# Patient Record
Sex: Female | Born: 1965 | Race: White | Hispanic: No | Marital: Married | Smoking: Never smoker
Health system: Southern US, Community
[De-identification: ages and names within clinical notes are randomized; demographics above are authoritative.]

## PROBLEM LIST (undated history)

## (undated) DIAGNOSIS — F32A Depression, unspecified: Secondary | ICD-10-CM

## (undated) DIAGNOSIS — F909 Attention-deficit hyperactivity disorder, unspecified type: Secondary | ICD-10-CM

## (undated) DIAGNOSIS — E079 Disorder of thyroid, unspecified: Secondary | ICD-10-CM

## (undated) DIAGNOSIS — E039 Hypothyroidism, unspecified: Secondary | ICD-10-CM

## (undated) DIAGNOSIS — F329 Major depressive disorder, single episode, unspecified: Secondary | ICD-10-CM

## (undated) DIAGNOSIS — F419 Anxiety disorder, unspecified: Secondary | ICD-10-CM

## (undated) DIAGNOSIS — Z87442 Personal history of urinary calculi: Secondary | ICD-10-CM

## (undated) DIAGNOSIS — R7303 Prediabetes: Secondary | ICD-10-CM

## (undated) HISTORY — DX: Disorder of thyroid, unspecified: E07.9

## (undated) HISTORY — DX: Depression, unspecified: F32.A

## (undated) HISTORY — PX: COLONOSCOPY W/ POLYPECTOMY: SHX1380

## (undated) HISTORY — DX: Anxiety disorder, unspecified: F41.9

## (undated) HISTORY — PX: ABDOMINAL HYSTERECTOMY: SHX81

---
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
## 2005-03-03 ENCOUNTER — Ambulatory Visit: Payer: Self-pay | Admitting: Internal Medicine

## 2006-06-18 ENCOUNTER — Ambulatory Visit: Payer: Self-pay | Admitting: Unknown Physician Specialty

## 2006-12-04 ENCOUNTER — Ambulatory Visit: Payer: Self-pay

## 2006-12-07 ENCOUNTER — Ambulatory Visit: Payer: Self-pay

## 2006-12-08 ENCOUNTER — Ambulatory Visit: Payer: Self-pay | Admitting: Internal Medicine

## 2006-12-09 ENCOUNTER — Ambulatory Visit: Payer: Self-pay | Admitting: Family Medicine

## 2006-12-21 ENCOUNTER — Ambulatory Visit: Payer: Self-pay | Admitting: Gastroenterology

## 2008-02-08 ENCOUNTER — Ambulatory Visit: Payer: Self-pay | Admitting: Unknown Physician Specialty

## 2009-07-17 ENCOUNTER — Observation Stay: Payer: Self-pay | Admitting: Internal Medicine

## 2010-02-18 ENCOUNTER — Ambulatory Visit: Payer: Self-pay | Admitting: Family Medicine

## 2011-05-06 ENCOUNTER — Ambulatory Visit: Payer: Self-pay | Admitting: Family Medicine

## 2014-08-17 ENCOUNTER — Ambulatory Visit: Admission: RE | Admit: 2014-08-17 | Payer: BC Managed Care – PPO | Source: Ambulatory Visit | Admitting: *Deleted

## 2014-08-22 ENCOUNTER — Ambulatory Visit
Admission: RE | Admit: 2014-08-22 | Discharge: 2014-08-22 | Disposition: A | Discharge status: Home / Self Care | Payer: BC Managed Care – PPO | Source: Ambulatory Visit | Attending: Family Medicine | Admitting: Family Medicine

## 2014-08-22 ENCOUNTER — Other Ambulatory Visit: Payer: Self-pay | Admitting: Family Medicine

## 2014-08-22 DIAGNOSIS — M25512 Pain in left shoulder: Secondary | ICD-10-CM | POA: Insufficient documentation

## 2014-08-22 DIAGNOSIS — R1032 Left lower quadrant pain: Secondary | ICD-10-CM

## 2014-08-29 ENCOUNTER — Ambulatory Visit
Admission: RE | Admit: 2014-08-29 | Discharge: 2014-08-29 | Disposition: A | Discharge status: Home / Self Care | Payer: BC Managed Care – PPO | Source: Ambulatory Visit | Attending: Family Medicine | Admitting: Family Medicine

## 2014-08-29 ENCOUNTER — Other Ambulatory Visit: Payer: BC Managed Care – PPO

## 2014-08-29 ENCOUNTER — Ambulatory Visit: Payer: BC Managed Care – PPO

## 2014-08-29 DIAGNOSIS — N8329 Other ovarian cysts: Secondary | ICD-10-CM | POA: Insufficient documentation

## 2014-08-29 DIAGNOSIS — R1032 Left lower quadrant pain: Secondary | ICD-10-CM

## 2015-05-17 ENCOUNTER — Other Ambulatory Visit: Payer: Self-pay | Admitting: Family Medicine

## 2015-05-17 ENCOUNTER — Ambulatory Visit
Admission: RE | Admit: 2015-05-17 | Discharge: 2015-05-17 | Disposition: A | Discharge status: Home / Self Care | Payer: BC Managed Care – PPO | Source: Ambulatory Visit | Attending: Family Medicine | Admitting: Family Medicine

## 2015-05-17 DIAGNOSIS — Z1231 Encounter for screening mammogram for malignant neoplasm of breast: Secondary | ICD-10-CM

## 2015-09-28 ENCOUNTER — Other Ambulatory Visit: Payer: Self-pay | Admitting: Family Medicine

## 2015-09-28 DIAGNOSIS — N83291 Other ovarian cyst, right side: Secondary | ICD-10-CM

## 2015-10-08 ENCOUNTER — Ambulatory Visit
Admission: RE | Admit: 2015-10-08 | Discharge: 2015-10-08 | Disposition: A | Discharge status: Home / Self Care | Payer: BC Managed Care – PPO | Source: Ambulatory Visit | Attending: Family Medicine | Admitting: Family Medicine

## 2015-10-08 DIAGNOSIS — N83291 Other ovarian cyst, right side: Secondary | ICD-10-CM | POA: Diagnosis not present

## 2016-08-25 ENCOUNTER — Other Ambulatory Visit: Payer: Self-pay | Admitting: Family Medicine

## 2016-08-25 DIAGNOSIS — Z1231 Encounter for screening mammogram for malignant neoplasm of breast: Secondary | ICD-10-CM

## 2016-09-01 ENCOUNTER — Ambulatory Visit
Admission: RE | Admit: 2016-09-01 | Discharge: 2016-09-01 | Disposition: A | Discharge status: Home / Self Care | Payer: BC Managed Care – PPO | Source: Ambulatory Visit | Attending: Family Medicine | Admitting: Family Medicine

## 2016-09-01 ENCOUNTER — Other Ambulatory Visit: Payer: Self-pay | Admitting: Family Medicine

## 2016-09-01 DIAGNOSIS — Z1231 Encounter for screening mammogram for malignant neoplasm of breast: Secondary | ICD-10-CM | POA: Insufficient documentation

## 2018-12-17 ENCOUNTER — Other Ambulatory Visit: Payer: Self-pay | Admitting: *Deleted

## 2018-12-17 ENCOUNTER — Ambulatory Visit: Payer: Self-pay | Admitting: Urology

## 2018-12-17 ENCOUNTER — Other Ambulatory Visit: Payer: Self-pay

## 2018-12-17 ENCOUNTER — Ambulatory Visit (INDEPENDENT_AMBULATORY_CARE_PROVIDER_SITE_OTHER): Payer: BC Managed Care – PPO | Admitting: Urology

## 2018-12-17 ENCOUNTER — Other Ambulatory Visit
Admission: RE | Admit: 2018-12-17 | Discharge: 2018-12-17 | Disposition: A | Discharge status: Home / Self Care | Payer: BC Managed Care – PPO | Attending: Urology | Admitting: Urology

## 2018-12-17 ENCOUNTER — Encounter: Payer: Self-pay | Admitting: Urology

## 2018-12-17 VITALS — BP 118/87 | HR 79 | Ht 65.0 in | Wt 180.0 lb

## 2018-12-17 DIAGNOSIS — R3129 Other microscopic hematuria: Secondary | ICD-10-CM | POA: Diagnosis not present

## 2018-12-17 DIAGNOSIS — N941 Unspecified dyspareunia: Secondary | ICD-10-CM | POA: Diagnosis not present

## 2018-12-17 LAB — URINALYSIS, COMPLETE (UACMP) WITH MICROSCOPIC
Bacteria, UA: NONE SEEN
Bilirubin Urine: NEGATIVE
Glucose, UA: NEGATIVE mg/dL
Ketones, ur: NEGATIVE mg/dL
Leukocytes,Ua: NEGATIVE
Nitrite: NEGATIVE
Protein, ur: NEGATIVE mg/dL
Specific Gravity, Urine: 1.02 (ref 1.005–1.030)
pH: 6 (ref 5.0–8.0)

## 2018-12-17 NOTE — Progress Notes (Signed)
12/17/2018 4:27 PM   Job Foundshonda D Feister 12/11/1965 [id number]  Referring provider: Dortha KernBliss, Laura K, MD [address] [place],  [place] [id number]  Chief Complaint  Patient presents with  . Hematuria    HPI: 53 year old female referred for further evaluation of possible microscopic hematuria.  She was seen and evaluated by her new primary care physician, Dr. Park BreedKahn at which time she was noted to have moderate blood on dip.  No microscopic exam was performed.  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  She does have a remote history of kidney stones.  She believes she passed a kidney stone about 10 years ago.  She reports that [facility] CT scan, noncontrast which showed no stones.  We do not have these records.  This was just recently.  Her mother had bladder cancer diagnosed in her 6980s.  She was a heavy smoker.  Her mother's twin brother also had bladder cancer.  Ms. Katrinka BlazingSmith herself has a remote history of social smoking in her late teens and early 4920s.  She is never smoker smoked regularly.  She does have a significant secondhand smoking exposure.  No industrial chemical exposure.  No flank pain.  She does have some chronic low back pain.  She also reports that she fell on a cast iron tub 3 weeks ago.  She is worried this may have caused an injury to her kidneys.  She had blood in her urine noted prior to this.  She also mentions today that she has developed dyspareunia over the past year.  She has dryness and also discomfort with intercourse.  Lubricants do not help much.  Prior to this year, she had no issues with painful intercourse.  This is the same partner.  She is postmenopausal.  She had abdominal hysterectomy about 25 years ago, laparoscopic.  Her cervix was left in place as well as 1 of her ovaries.  She reports that she has an irregular Pap smear and will have it repeated in 6 months.  She has a remote history of recurrent  urinary tract infections.  She is not really had any issues with this over the past year but was having them frequently about 2 years ago.   PMH: Past Medical History:  Diagnosis Date  . Anxiety   . Depression   . Thyroid disease     Surgical History: Past Surgical History:  Procedure Laterality Date  . ABDOMINAL HYSTERECTOMY    . CESAREAN SECTION      Home Medications:  Allergies as of 12/17/2018   No Known Allergies     Medication List       Accurate as of December 17, 2018  4:27 PM. If you have any questions, ask your nurse or doctor.        levothyroxine 75 MCG tablet Commonly known as: SYNTHROID Take 75 mcg by mouth daily before breakfast.   PARoxetine 20 MG tablet Commonly known as: PAXIL Take 20 mg by mouth daily.       Allergies: No Known Allergies  Family History: Family History  Problem Relation Age of Onset  . Breast cancer Cousin        mat cousin  . Bladder Cancer Mother   . Bladder Cancer Maternal Uncle     Social History:  reports that she has never smoked. She has never used smokeless tobacco. She reports current alcohol use. No history on file for drug.  ROS: UROLOGY Frequent Urination?: No Hard to postpone urination?: Yes Burning/pain with urination?: No Get up at night to urinate?: No Leakage of urine?: No Urine stream starts and stops?: No Trouble starting stream?: No Do you have to strain to urinate?: No Blood in urine?: Yes Urinary tract infection?: No Sexually transmitted disease?: No Injury to kidneys or bladder?: Yes Painful intercourse?: No Weak stream?: No Currently pregnant?: No Vaginal bleeding?: No Last menstrual period?: n  Gastrointestinal Nausea?: No Vomiting?: No Indigestion/heartburn?: No Diarrhea?: No Constipation?: No  Constitutional Fever: No Night sweats?: No Weight loss?: No Fatigue?: No  Skin Skin rash/lesions?: No Itching?: No  Eyes Blurred vision?: No Double vision?: No   Ears/Nose/Throat Sore throat?: No Sinus problems?: No  Hematologic/Lymphatic Swollen glands?: No Easy bruising?: No  Cardiovascular Leg swelling?: No Chest pain?: No  Respiratory Cough?: No Shortness of breath?: No  Endocrine Excessive thirst?: No  Musculoskeletal Back pain?: No Joint pain?: No  Neurological Headaches?: No Dizziness?: No  Psychologic Depression?: No Anxiety?: No  Physical Exam: BP 118/87   Pulse 79   Ht 5\' 5"  (1.651 m)   Wt 180 lb (81.6 kg)   BMI 29.95 kg/m   Constitutional:  Alert and oriented, No acute distress. HEENT: [place] AT, moist mucus membranes.  Trachea midline, no masses. Cardiovascular: No clubbing, cyanosis, or edema. Respiratory: Normal respiratory effort, no increased work of breathing. GI: Abdomen is soft, nontender, nondistended, no abdominal masses Skin: No rashes, bruises or suspicious lesions. Neurologic: Grossly intact, no focal deficits, moving all 4 extremities. Psychiatric: Normal mood and affect.  Laboratory Data:  Urinalysis    Component Value Date/Time   COLORURINE YELLOW 12/17/2018 1543   APPEARANCEUR CLEAR 12/17/2018 1543   LABSPEC 1.020 12/17/2018 1543   PHURINE 6.0 12/17/2018 1543   GLUCOSEU NEGATIVE 12/17/2018 1543   HGBUR MODERATE (A) 12/17/2018 1543   BILIRUBINUR NEGATIVE 12/17/2018 1543   KETONESUR NEGATIVE 12/17/2018 1543   PROTEINUR NEGATIVE 12/17/2018 1543   NITRITE NEGATIVE 12/17/2018 1543   LEUKOCYTESUR NEGATIVE 12/17/2018 1543    Lab Results  Component Value Date   BACTERIA NONE SEEN 12/17/2018   11-20 red blood cells per high-powered field.  Pertinent Imaging: N/A  Assessment & Plan:    1. Microscopic hematuria We discussed the differential diagnosis for microscopic hematuria including nephrolithiasis, renal or upper tract tumors, bladder stones, UTIs, or bladder tumors as well as undetermined etiologies. Per AUA guidelines, I did recommend complete microscopic hematuria evaluation  including CTU, possible urine cytology, and office cystoscopy.  2. Dyspareunia, female May be related to vaginal dryness Pelvic exam at the same time of cystoscopy May benefit from topical estrogen cream both for history of recurrent UTIs as well as dyspareunia and vaginal dryness-would like to see results of Pap given that she was told was abnormal with "inflammation" but not cancer.   Return in about 4 weeks (around 01/14/2019) for CT scan, cysto, pelvic.  Hollice Espy, MD  [facility] [address] [place], [place] [address] [phone]  Patient signed a release for additional records for CT scan results as well as Pap results

## 2018-12-22 ENCOUNTER — Other Ambulatory Visit: Payer: Self-pay

## 2018-12-28 ENCOUNTER — Telehealth: Payer: Self-pay | Admitting: Urology

## 2018-12-28 DIAGNOSIS — R3129 Other microscopic hematuria: Secondary | ICD-10-CM

## 2018-12-28 NOTE — Telephone Encounter (Signed)
I spoke with the patient and she is ok with waiting until her app  However I do not see an order for a CT scan, I did see in the notes that we needed her last CT scan from Dr. Humphrey Rolls which is scanned in her chart under media. If she does need another on can I please get an order so that I can get this approved and scheduled please and thank you?    Sharyn Lull

## 2018-12-28 NOTE — Telephone Encounter (Signed)
Pt called and wants to be seen sooner than her appt on  01/14/2019, and have her CT done sooner. She states that it is ok to be seen at [facility] or [place]. Please advise.

## 2018-12-28 NOTE — Telephone Encounter (Signed)
As long as there is something available schedule wise that's fine, otherwise unfortunately she will have to wait. Of course radiology handles CT so we don't have any control over how soon they schedule her

## 2018-12-29 NOTE — Telephone Encounter (Signed)
Orders placed.

## 2018-12-31 ENCOUNTER — Ambulatory Visit: Payer: Self-pay | Admitting: Urology

## 2019-01-10 ENCOUNTER — Other Ambulatory Visit: Payer: Self-pay

## 2019-01-10 ENCOUNTER — Ambulatory Visit
Admission: RE | Admit: 2019-01-10 | Discharge: 2019-01-10 | Disposition: A | Discharge status: Home / Self Care | Payer: BC Managed Care – PPO | Source: Ambulatory Visit | Attending: Urology | Admitting: Urology

## 2019-01-10 DIAGNOSIS — R3129 Other microscopic hematuria: Secondary | ICD-10-CM | POA: Diagnosis present

## 2019-01-10 MED ORDER — IOHEXOL 300 MG/ML  SOLN
125.0000 mL | Freq: Once | INTRAMUSCULAR | Status: AC | PRN
  Administered 2019-01-10: 08:00:00 125 mL via INTRAVENOUS

## 2019-01-11 ENCOUNTER — Other Ambulatory Visit: Payer: Self-pay

## 2019-01-11 DIAGNOSIS — R3129 Other microscopic hematuria: Secondary | ICD-10-CM

## 2019-01-14 ENCOUNTER — Other Ambulatory Visit
Admission: RE | Admit: 2019-01-14 | Discharge: 2019-01-14 | Disposition: A | Discharge status: Home / Self Care | Payer: BC Managed Care – PPO | Attending: Urology | Admitting: Urology

## 2019-01-14 ENCOUNTER — Ambulatory Visit: Payer: BC Managed Care – PPO | Admitting: Urology

## 2019-01-14 ENCOUNTER — Other Ambulatory Visit: Payer: Self-pay

## 2019-01-14 ENCOUNTER — Encounter: Payer: Self-pay | Admitting: Urology

## 2019-01-14 VITALS — BP 123/96 | HR 87 | Ht 65.0 in | Wt 180.0 lb

## 2019-01-14 DIAGNOSIS — R3129 Other microscopic hematuria: Secondary | ICD-10-CM | POA: Diagnosis present

## 2019-01-14 DIAGNOSIS — N941 Unspecified dyspareunia: Secondary | ICD-10-CM

## 2019-01-14 LAB — URINALYSIS, COMPLETE (UACMP) WITH MICROSCOPIC
Bacteria, UA: NONE SEEN
Bilirubin Urine: NEGATIVE
Glucose, UA: NEGATIVE mg/dL
Ketones, ur: NEGATIVE mg/dL
Leukocytes,Ua: NEGATIVE
Nitrite: NEGATIVE
Protein, ur: NEGATIVE mg/dL
Specific Gravity, Urine: 1.02 (ref 1.005–1.030)
pH: 5 (ref 5.0–8.0)

## 2019-01-14 NOTE — Progress Notes (Signed)
   01/20/19  CC:  Chief Complaint  Patient presents with  . Cysto    HPI: 53 year old female with back scopic hematuria who presents today for cystoscopic evaluation.  In the interim, she is undergone CT urogram on 01/10/2019 which is completely unremarkable, without any pathology.  This was personally reviewed today and agree with radiologic interpretation.  Blood pressure (!) 123/96, pulse 87, height 5\' 5"  (1.651 m), weight 180 lb (81.6 kg). NED. A&Ox3.   No respiratory distress   Abd soft, NT, ND Normal external genitalia with patent urethral meatus Notably, there is some atrophic vaginal mucosa and periurethral mucosa with fairly good pelvic floor support without obvious prolapse.  Cystoscopy Procedure Note  Patient identification was confirmed, informed consent was obtained, and patient was prepped using Betadine solution.  Lidocaine jelly was administered per urethral meatus.    Procedure: - Flexible cystoscope introduced, without any difficulty.   - Thorough search of the bladder revealed:    normal urethral meatus    normal urothelium    no stones    no ulcers     no tumors    no urethral polyps    no trabeculation  - Ureteral orifices were normal in position and appearance.  Post-Procedure: - Patient tolerated the procedure well  Assessment/ Plan:  1. Microscopic hematuria s/p negative evaluation clotting cystoscopy and negative CT urogram This is reassuring Given her smoking history and degree of hematuria, they go to follow-up with [place] in a year with a UA possible urine cytology.  She would like any a cystoscopy every 2 to 3 years of her microscopic hematuria persist.  She is agreeable with this plan.  2. Dyspareunia, female Yet to receive results of abnormal Pap, will hold off on treating with topical estrogen cream until we receive this or she can follow-up with her primary care    Return in about 1 year (around 01/14/2020) for [facility] for UA.  Hollice Espy, MD

## 2020-01-16 ENCOUNTER — Ambulatory Visit: Payer: BC Managed Care – PPO | Admitting: Urology

## 2021-06-20 ENCOUNTER — Other Ambulatory Visit: Payer: Self-pay | Admitting: Orthopedic Surgery

## 2021-06-20 ENCOUNTER — Other Ambulatory Visit (HOSPITAL_COMMUNITY): Payer: Self-pay | Admitting: Orthopedic Surgery

## 2021-06-20 DIAGNOSIS — M25561 Pain in right knee: Secondary | ICD-10-CM

## 2021-06-26 ENCOUNTER — Other Ambulatory Visit: Payer: Self-pay

## 2021-06-26 ENCOUNTER — Ambulatory Visit
Admission: RE | Admit: 2021-06-26 | Discharge: 2021-06-26 | Disposition: A | Discharge status: Home / Self Care | Payer: BC Managed Care – PPO | Source: Ambulatory Visit | Attending: Orthopedic Surgery | Admitting: Orthopedic Surgery

## 2021-06-26 DIAGNOSIS — M25561 Pain in right knee: Secondary | ICD-10-CM | POA: Insufficient documentation

## 2021-08-11 NOTE — Discharge Instructions (Addendum)
Instructions after Knee Arthroscopy    James P. Hooten, Jr., M.D.     Dept. of Orthopaedics & Sports Medicine  [facility]  [address]  [place], [place]  [address]   Phone: [phone]   Fax: [phone]   DIET: Drink plenty of non-alcoholic fluids & begin a light diet. Resume your normal diet the day after surgery.  ACTIVITY:  You may use crutches or a walker with weight-bearing as tolerated, unless instructed otherwise. You may wean yourself off of the walker or crutches as tolerated.  Begin doing gentle exercises. Exercising will reduce the pain and swelling, increase motion, and prevent muscle weakness.   Avoid strenuous activities or athletics for a minimum of 4-6 weeks after arthroscopic surgery. Do not drive or operate any equipment until instructed.  WOUND CARE:  Place one to two pillows under the knee the first day or two when sitting or lying.  Continue to use the ice packs periodically to reduce pain and swelling. The small incisions in your knee are closed with nylon stitches. The stitches will be removed in the office. The bulky dressing may be removed on the second day after surgery. DO NOT TOUCH THE STITCHES. Put a Band-Aid over each stitch. Do NOT use any ointments or creams on the incisions.  You may bathe or shower after the stitches are removed at the first office visit following surgery.  MEDICATIONS: You may resume your regular medications. Please take the pain medication as prescribed. Do not take pain medication on an empty stomach. Do not drive or drink alcoholic beverages when taking pain medications.  CALL THE OFFICE FOR: Temperature above 101 degrees Excessive bleeding or drainage on the dressing. Excessive swelling, coldness, or paleness of the toes. Persistent nausea and vomiting.  FOLLOW-UP:  You should have an appointment to return to the office in 7-10 days after surgery.      [facility] Department Directory          [email or web address]       [email or web address]          Cardiology  Appointments: [place] - [phone] [place] - [phone]  Endocrinology  Appointments: [place] - [phone] [place] - [phone]  Gastroenterology  Appointments: [place] - [phone] [place] - [phone]        General Surgery   Appointments: [place] - [phone]  Internal Medicine/Family Medicine  Appointments: [place] - [phone] [place] - [phone] [place] - [phone]  Metabolic and Weigh Loss Surgery  Appointments: [place] - [phone]        Neurology  Appointments: [place] - [phone] [place] - [phone]  Neurosurgery  Appointments: [place] - [phone]  Obstetrics & Gynecology  Appointments: [place] - [phone] [place] - [phone]        Pediatrics  Appointments: [place] - [phone] [place] - [phone]  Physiatry  Appointments: [place] -[phone]  Physical Therapy  Appointments: [place] - [phone] [place] - [phone]        Podiatry  Appointments: [place] - [phone] [place] - [phone]  Pulmonology  Appointments: [place] - [phone]  Rheumatology  Appointments: [place] - [phone]        [place] Location: [facility]  [address] , [place]  [address]  [place] Location: [facility] [address] [place], [place]  [address]  [place] Location: [facility] [address] [place], [place]  [address]      AMBULATORY SURGERY  DISCHARGE INSTRUCTIONS   The drugs that you were given will stay in your system until tomorrow so for the next 24   hours you should not:  Drive an automobile Make any legal decisions Drink any alcoholic beverage   You may resume regular meals tomorrow.  Today it is better to start with liquids and gradually work up to solid foods.  You may eat anything you prefer, but it is better to  start with liquids, then soup and crackers, and gradually work up to solid foods.   Please notify your doctor immediately if you have any unusual bleeding, trouble breathing, redness and pain at the surgery site, drainage, fever, or pain not relieved by medication.    Additional Instructions:   Please contact your physician with any problems or Same Day Surgery at [phone], Monday through Friday 6 am to 4 pm, or [place] at [place] Main number at [phone]. 

## 2021-08-14 ENCOUNTER — Other Ambulatory Visit
Admission: RE | Admit: 2021-08-14 | Discharge: 2021-08-14 | Disposition: A | Discharge status: Home / Self Care | Payer: BC Managed Care – PPO | Source: Ambulatory Visit | Attending: Orthopedic Surgery | Admitting: Orthopedic Surgery

## 2021-08-14 ENCOUNTER — Other Ambulatory Visit: Payer: Self-pay

## 2021-08-14 HISTORY — DX: Hypothyroidism, unspecified: E03.9

## 2021-08-14 HISTORY — DX: Personal history of urinary calculi: Z87.442

## 2021-08-14 HISTORY — DX: Attention-deficit hyperactivity disorder, unspecified type: F90.9

## 2021-08-14 HISTORY — DX: Prediabetes: R73.03

## 2021-08-14 NOTE — Patient Instructions (Addendum)
Your procedure is scheduled on: 08/21/21 - Wednesday ?Report to the Registration Desk on the 1st floor of the [facility]. ?To find out your arrival time, please call [phone] between 1PM - 3PM on: 08/20/21 - Tuesday ?If your arrival time is 6:00 am, do not arrive prior to that time as the [facility] entrance doors do not open until 6:00 am. ? ?REMEMBER: ?Instructions that are not followed completely may result in serious medical risk, up to and including death; or upon the discretion of your surgeon and anesthesiologist your surgery may need to be rescheduled. ? ?Do not eat food after midnight the night before surgery.  ?No gum chewing, lozengers or hard candies. ? ?You may however, drink CLEAR liquids up to 2 hours before you are scheduled to arrive for your surgery. Do not drink anything within 2 hours of your scheduled arrival time. ? ?Clear liquids include: ?- water  ?- apple juice without pulp ?- gatorade (not RED colors) ?- black coffee or tea (Do NOT add milk or creamers to the coffee or tea) ?Do NOT drink anything that is not on this list. ? ?TAKE THESE MEDICATIONS THE MORNING OF SURGERY WITH A SIP OF WATER: ? ?- levothyroxine (SYNTHROID) 75 MCG tablet ? ?One week prior to surgery: Meloxicam (MOBIC) 7.5 MG tablet ?Stop Anti-inflammatories (NSAIDS) such as Advil, Aleve, Ibuprofen, Motrin, Naproxen, Naprosyn and Aspirin based products such as Excedrin, Goodys Powder, BC Powder. ? ?Stop ANY OVER THE COUNTER supplements until after surgery. ? ?You may take Tylenol if needed for pain up until the day of surgery. ? ?No Alcohol for 24 hours before or after surgery. ? ?No Smoking including e-cigarettes for 24 hours prior to surgery.  ?No chewable tobacco products for at least 6 hours prior to surgery.  ?No nicotine patches on the day of surgery. ? ?Do not use any "recreational" drugs for at least a week prior to your surgery.  ?Please be advised that the combination of cocaine and anesthesia may have  negative outcomes, up to and including death. ?If you test positive for cocaine, your surgery will be cancelled. ? ?On the morning of surgery brush your teeth with toothpaste and water, you may rinse your mouth with mouthwash if you wish. ?Do not swallow any toothpaste or mouthwash. ? ?Use CHG Soap or wipes as directed on instruction sheet. ? ?Do not wear jewelry, make-up, hairpins, clips or nail polish. ? ?Do not wear lotions, powders, or perfumes.  ? ?Do not shave body from the neck down 48 hours prior to surgery just in case you cut yourself which could leave a site for infection.  ?Also, freshly shaved skin may become irritated if using the CHG soap. ? ?Contact lenses, hearing aids and dentures may not be worn into surgery. ? ?Do not bring valuables to the hospital. [facility] is not responsible for any missing/lost belongings or valuables.  ? ?Notify your doctor if there is any change in your medical condition (cold, fever, infection). ? ?Wear comfortable clothing (specific to your surgery type) to the hospital. ? ?After surgery, you can help prevent lung complications by doing breathing exercises.  ?Take deep breaths and cough every 1-2 hours. Your doctor may order a device called an Incentive Spirometer to help you take deep breaths. ?When coughing or sneezing, hold a pillow firmly against your incision with both hands. This is called ?splinting.? Doing this helps protect your incision. It also decreases belly discomfort. ? ?If you are being admitted to the  hospital overnight, leave your suitcase in the car. ?After surgery it may be brought to your room. ? ?If you are being discharged the day of surgery, you will not be allowed to drive home. ?You will need a responsible adult (18 years or older) to drive you home and stay with you that night.  ? ?If you are taking public transportation, you will need to have a responsible adult (18 years or older) with you. ?Please confirm with your physician that it is  acceptable to use public transportation.  ? ?Please call the Pre-admissions Testing Dept. at [phone] if you have any questions about these instructions. ? ?Surgery Visitation Policy: ? ?Patients undergoing a surgery or procedure may have two family members or support persons with them as long as the person is not COVID-19 positive or experiencing its symptoms.  ? ?Inpatient Visitation:   ? ?Visiting hours are 7 a.m. to 8 p.m. ?Up to four visitors are allowed at one time in a patient room, including children. The visitors may rotate out with other people during the day. One designated support person (adult) may remain overnight.  ?

## 2021-08-18 NOTE — H&P (Signed)
ORTHOPAEDIC HISTORY & PHYSICAL ?Eugene Zeiders, Adelina MingsJames Philmon Jr., MD - 07/29/2021 2:30 PM EDT ?Formatting of this note is different from the original. ?Images from the original note were not included. ?Chief Complaint: ?Chief Complaint  ?Patient presents with  ? Right knee meniscu tear MRI 06/27/21  ? ?Reason for Visit: ?The patient is a 56 y.o. female who presents today for reevaluation of her right knee. She reports a 2 year history of right knee pain. She does not recall any specific trauma or aggravating event. She localizes most of the pain along the medial aspect of the knee and describes the pain as "sharp and catching". She reports some swelling, no locking, and no giving way of the knee. The pain is aggravated by lateral movements and pivoting. The patient has not appreciated any significant improvement despite oral and topical NSAIDs, intraarticular corticosteroid injection, and activity modification.  ? ?Medications: ?Current Outpatient Medications  ?Medication Sig Dispense Refill  ? levothyroxine (SYNTHROID) 75 MCG tablet Take 75 mcg by mouth once daily Take on an empty stomach with a glass of water at least 30-60 minutes before breakfast.  ? PARoxetine (PAXIL) 20 MG tablet TAKE 1 TABLET BY MOUTH EVERY DAY - **NOTE DOSE INCREASE**  ? ?No current facility-administered medications for this visit.  ? ?Allergies: ?No Known Allergies ? ?Past Medical History: ?Past Medical History:  ?Diagnosis Date  ? Chicken pox  ? Hyperlipidemia  ? Hypothyroidism  ? ?Past Surgical History: ?Past Surgical History:  ?Procedure Laterality Date  ? COLONOSCOPY 05/16/2020  ?Diverticulosis/Otherwise normal/PHx CP/Repeat 5 to 7893yrs/[place]  ? CESAREAN SECTION  ? WISDOM TEETH  ? ?Social History: ?Social History  ? ?Socioeconomic History  ? Marital status: Divorced  ? Number of children: 2  ? Years of education: GED  ? Highest education level: GED or equivalent  ?Occupational History  ? Occupation: Environmental education officerull-time - Surveyor, miningchool Bus Driver  ?Tobacco Use  ?  Smoking status: Never  ? Smokeless tobacco: Never  ?Substance and Sexual Activity  ? Alcohol use: Never  ? Drug use: Never  ? Sexual activity: Defer  ?Partners: Male  ? ?Family History: ?Family History  ?Problem Relation Age of Onset  ? Bladder Cancer Mother  ? Emphysema Father  ? Thyroid disease Sister  ? COPD Brother  ? ?Review of Systems: ?A comprehensive 14 point ROS was performed, reviewed, and the pertinent orthopaedic findings are documented in the HPI. ? ?Exam ?BP 132/84  Ht 165.1 cm (5\' 5" )  Wt 87 kg (191 lb 12.8 oz)  BMI 31.92 kg/m?  ? ?General:  ?Well-developed, well-nourished female seen in no acute distress.  ?Even heel to toe gait.  ?No varus or valgus thrust to the right knee. ? ?HEENT:  ?Atraumatic, normocephalic. Pupils are equal and reactive to light. Extraocular motion is intact. Sclera are clear. Oropharynx is clear with moist mucosa. ? ?Lungs:  ?Clear to auscultation bilaterally. ? ?Cardiovascular: ?Regular rate and rhythm. Normal S1, S2. No murmur . No appreciable gallops or rubs. Peripheral pulses are palpable. No lower extremity edema. Homan`s test is negative.  ? ?Extremities: ?Good strength, stability, and range of motion of the upper extremities. ?Good range of motion of the hips and ankles. ? ?Right Knee:  ?Soft tissue swelling: minimal ?Effusion: none ?Erythema: none ?Crepitance: none ?Tenderness: medial ?Alignment: normal ?Mediolateral laxity: stable ?Anterior drawer test:negative ?Lachman`s test: negative ?McMurray`s test: positive ?Atrophy: No significant atrophy.  ?Quadriceps tone was fair to good. ?Range of Motion: greater than 120 degrees ? ?Neurologic:  ?Awake, alert, and oriented.  ?  Sensory function is intact to pinprick and light touch.  ?Motor strength is judged to be 5/5.  ?Motor coordination is within normal limits.  ?No apparent clonus. No tremor.  ? ?MRI: ?I reviewed the right knee MRI from [facility] dated 06/27/2021. I concur with the radiologist's  interpretation as below: ? ?MRI OF THE RIGHT KNEE WITHOUT CONTRAST  ? ?TECHNIQUE:  ?Multiplanar, multisequence MR imaging of the knee was performed. No  ?intravenous contrast was administered.  ? ?COMPARISON:  None.  ? ?FINDINGS:  ?MENISCI  ? ?Medial meniscus: Irregular free edge and undersurface tearing of the  ?medial meniscus centered at the posterior horn-body junction (series  ?7, images 21-23).  ? ?Lateral meniscus:  Intact.  ? ?LIGAMENTS  ? ?Cruciates: Intact ACL and PCL.  ? ?Collaterals: Intact MCL with mild periligamentous edema. Lateral  ?collateral ligament complex intact.  ? ?CARTILAGE  ? ?Patellofemoral: High-grade cartilage loss along the inferior margin  ?of the lateral patellar facet.  ? ?Medial: Mild chondral thinning of the weight-bearing medial  ?compartment.  ? ?Lateral:  No chondral defect.  ? ?MISCELLANEOUS  ? ?Joint: Small joint effusion. Mild focal edema within the  ?superolateral aspect of Hoffa's fat.  ? ?Popliteal Fossa:  Small Baker's cyst. Intact popliteus tendon.  ? ?Extensor Mechanism: Mild tendinosis of the distal quadriceps tendon  ?and distal patellar tendons without tear.  ? ?Bones: No acute fracture. No dislocation. Mild reactive subchondral  ?marrow signal changes in the lateral patella. Otherwise, no bone  ?marrow edema. No marrow replacing bone lesion.  ? ?Other: No significant periarticular soft tissue findings.  ? ?IMPRESSION:  ?1. Medial and patellofemoral compartment osteoarthritis with  ?high-grade cartilage loss along the inferior margin of the lateral  ?patellar facet.  ?2. Irregular free edge and undersurface tearing of the medial  ?meniscus centered at the posterior horn-body junction.  ?3. Grade 1 MCL sprain.  ?4. Mild tendinosis of the distal quadriceps tendon and distal  ?patellar tendon without tear.  ?5. Small joint effusion and small Baker's cyst.  ?6. Focal edema within the superolateral aspect of Hoffa's fat pad,  ?which can be seen in the setting of patellar  tendon-lateral femoral  ?condyle friction syndrome.  ? ?Electronically Signed  ?  By: Duanne Guess D.O.  ?  On: 06/27/2021 11:49 ? ?Impression: ?Internal derangement of the right knee ? ?Plan:  ?The findings were discussed in detail with the patient. ?The patient was given informational material on knee arthroscopy. ?Conservative treatment options were reviewed with the patient. We discussed the risks and benefits of surgical intervention. Arthroscopy is an appropriate treatment for the meniscal pathology, but would have limited or no effect on degenerative changes of the articular cartilage. The usual perioperative course was also discussed in detail. The patient expressed understanding of the risks and benefits of surgical intervention and would like to proceed with plans for right knee arthroscopy. ? ?I spent a total of 45 minutes in both face-to-face and non-face-to-face activities, excluding procedures performed, for this visit on the date of this encounter. ? ?MEDICAL CLEARANCE: ?Per anesthesiology. ?ACTIVITIES:  ?Avoid pivoting, squatting, or twisting. ?WORK STATUS: ?Anticipate out of work for 2-3 weeks. ?THERAPY: ?Quadriceps strengthening exercises. ?MEDICATIONS: ?Requested Prescriptions  ? ?No prescriptions requested or ordered in this encounter  ? ?FOLLOW-UP: ?Return for postoperative follow-up.  ? ?Nadalie Laughner P. Angie Fava., M.D. ? ?This note was generated in part with voice recognition software and I apologize for any typographical errors that were not detected and corrected.  ?  Electronically signed by Shari Heritage., MD at 08/04/2021 8:14 AM EDT ? ?

## 2021-08-20 MED ORDER — CHLORHEXIDINE GLUCONATE 0.12 % MT SOLN
15.0000 mL | Freq: Once | OROMUCOSAL | Status: AC
  Administered 2021-08-21: 15 mL via OROMUCOSAL

## 2021-08-20 MED ORDER — LACTATED RINGERS IV SOLN: INTRAVENOUS | Status: DC

## 2021-08-20 MED ORDER — ORAL CARE MOUTH RINSE: 15.0000 mL | Freq: Once | OROMUCOSAL | Status: AC

## 2021-08-20 MED ORDER — CELECOXIB 200 MG PO CAPS
400.0000 mg | ORAL_CAPSULE | Freq: Once | ORAL | Status: AC
Start: 2021-08-20 — End: 2021-08-21
  Administered 2021-08-21: 400 mg via ORAL

## 2021-08-20 MED ORDER — FAMOTIDINE 20 MG PO TABS
20.0000 mg | ORAL_TABLET | Freq: Once | ORAL | Status: AC
Start: 2021-08-20 — End: 2021-08-21
  Administered 2021-08-21: 20 mg via ORAL

## 2021-08-21 ENCOUNTER — Encounter
Admission: RE | Disposition: A | Discharge status: Home / Self Care | Payer: Self-pay | Source: Home / Self Care | Attending: Orthopedic Surgery

## 2021-08-21 ENCOUNTER — Encounter: Payer: Self-pay | Admitting: Orthopedic Surgery

## 2021-08-21 ENCOUNTER — Ambulatory Visit
Admission: RE | Admit: 2021-08-21 | Discharge: 2021-08-21 | Disposition: A | Discharge status: Home / Self Care | Payer: BC Managed Care – PPO | Attending: Orthopedic Surgery | Admitting: Orthopedic Surgery

## 2021-08-21 ENCOUNTER — Ambulatory Visit: Payer: BC Managed Care – PPO | Admitting: Certified Registered Nurse Anesthetist

## 2021-08-21 ENCOUNTER — Other Ambulatory Visit: Payer: Self-pay

## 2021-08-21 DIAGNOSIS — E039 Hypothyroidism, unspecified: Secondary | ICD-10-CM | POA: Insufficient documentation

## 2021-08-21 DIAGNOSIS — Z9889 Other specified postprocedural states: Secondary | ICD-10-CM

## 2021-08-21 DIAGNOSIS — M2391 Unspecified internal derangement of right knee: Secondary | ICD-10-CM | POA: Insufficient documentation

## 2021-08-21 DIAGNOSIS — X58XXXA Exposure to other specified factors, initial encounter: Secondary | ICD-10-CM | POA: Diagnosis not present

## 2021-08-21 DIAGNOSIS — S83241A Other tear of medial meniscus, current injury, right knee, initial encounter: Secondary | ICD-10-CM | POA: Diagnosis present

## 2021-08-21 HISTORY — PX: KNEE ARTHROSCOPY: SHX127

## 2021-08-21 SURGERY — ARTHROSCOPY, KNEE
Anesthesia: General | Site: Knee | Laterality: Right

## 2021-08-21 MED ORDER — PROPOFOL 10 MG/ML IV BOLUS
INTRAVENOUS | Status: DC | PRN
  Administered 2021-08-21: 150 mg via INTRAVENOUS

## 2021-08-21 MED ORDER — PROPOFOL 10 MG/ML IV BOLUS
INTRAVENOUS | Status: AC
  Filled 2021-08-21: qty 20

## 2021-08-21 MED ORDER — MORPHINE SULFATE (PF) 4 MG/ML IV SOLN
INTRAVENOUS | Status: DC | PRN
  Administered 2021-08-21: 31 mL via INTRAMUSCULAR

## 2021-08-21 MED ORDER — MORPHINE SULFATE (PF) 4 MG/ML IV SOLN
INTRAVENOUS | Status: AC
  Filled 2021-08-21: qty 1

## 2021-08-21 MED ORDER — HYDROCODONE-ACETAMINOPHEN 7.5-325 MG PO TABS: 1.0000 | ORAL_TABLET | ORAL | Status: DC | PRN

## 2021-08-21 MED ORDER — DEXAMETHASONE SODIUM PHOSPHATE 10 MG/ML IJ SOLN
INTRAMUSCULAR | Status: DC | PRN
  Administered 2021-08-21: 8 mg via INTRAVENOUS

## 2021-08-21 MED ORDER — FENTANYL CITRATE (PF) 100 MCG/2ML IJ SOLN
INTRAMUSCULAR | Status: DC | PRN
  Administered 2021-08-21 (×2): 50 ug via INTRAVENOUS

## 2021-08-21 MED ORDER — MORPHINE SULFATE (PF) 2 MG/ML IV SOLN: 0.5000 mg | INTRAVENOUS | Status: DC | PRN

## 2021-08-21 MED ORDER — OXYCODONE HCL 5 MG/5ML PO SOLN: 5.0000 mg | Freq: Once | ORAL | Status: AC | PRN

## 2021-08-21 MED ORDER — CHLORHEXIDINE GLUCONATE 0.12 % MT SOLN
OROMUCOSAL | Status: AC
  Filled 2021-08-21: qty 15

## 2021-08-21 MED ORDER — ACETAMINOPHEN 10 MG/ML IV SOLN
INTRAVENOUS | Status: DC | PRN
  Administered 2021-08-21: 1000 mg via INTRAVENOUS

## 2021-08-21 MED ORDER — ONDANSETRON HCL 4 MG/2ML IJ SOLN
INTRAMUSCULAR | Status: DC | PRN
  Administered 2021-08-21: 4 mg via INTRAVENOUS

## 2021-08-21 MED ORDER — BUPIVACAINE-EPINEPHRINE (PF) 0.25% -1:200000 IJ SOLN
INTRAMUSCULAR | Status: AC
  Filled 2021-08-21: qty 30

## 2021-08-21 MED ORDER — HYDROCODONE-ACETAMINOPHEN 5-325 MG PO TABS: 1.0000 | ORAL_TABLET | Freq: Four times a day (QID) | ORAL | 0 refills | Status: DC | PRN

## 2021-08-21 MED ORDER — OXYCODONE HCL 5 MG PO TABS
ORAL_TABLET | ORAL | Status: AC
  Filled 2021-08-21: qty 1

## 2021-08-21 MED ORDER — LIDOCAINE HCL (CARDIAC) PF 100 MG/5ML IV SOSY
PREFILLED_SYRINGE | INTRAVENOUS | Status: DC | PRN
  Administered 2021-08-21: 100 mg via INTRAVENOUS

## 2021-08-21 MED ORDER — LIDOCAINE HCL (PF) 2 % IJ SOLN
INTRAMUSCULAR | Status: AC
  Filled 2021-08-21: qty 5

## 2021-08-21 MED ORDER — GLYCOPYRROLATE 0.2 MG/ML IJ SOLN
INTRAMUSCULAR | Status: DC | PRN
  Administered 2021-08-21: .2 mg via INTRAVENOUS

## 2021-08-21 MED ORDER — ONDANSETRON HCL 4 MG/2ML IJ SOLN: 4.0000 mg | Freq: Four times a day (QID) | INTRAMUSCULAR | Status: DC | PRN

## 2021-08-21 MED ORDER — SODIUM CHLORIDE 0.9 % IV SOLN: INTRAVENOUS | Status: DC

## 2021-08-21 MED ORDER — PHENYLEPHRINE HCL (PRESSORS) 10 MG/ML IV SOLN
INTRAVENOUS | Status: DC | PRN
  Administered 2021-08-21 (×2): 80 ug via INTRAVENOUS
  Administered 2021-08-21 (×2): 40 ug via INTRAVENOUS
  Administered 2021-08-21 (×4): 80 ug via INTRAVENOUS

## 2021-08-21 MED ORDER — ONDANSETRON HCL 4 MG PO TABS: 4.0000 mg | ORAL_TABLET | Freq: Four times a day (QID) | ORAL | Status: DC | PRN

## 2021-08-21 MED ORDER — ACETAMINOPHEN 325 MG PO TABS: 325.0000 mg | ORAL_TABLET | Freq: Four times a day (QID) | ORAL | Status: DC | PRN

## 2021-08-21 MED ORDER — CELECOXIB 200 MG PO CAPS
ORAL_CAPSULE | ORAL | Status: DC
Start: 2021-08-21 — End: 2021-08-21
  Filled 2021-08-21: qty 2

## 2021-08-21 MED ORDER — METOCLOPRAMIDE HCL 5 MG/ML IJ SOLN: 5.0000 mg | Freq: Three times a day (TID) | INTRAMUSCULAR | Status: DC | PRN

## 2021-08-21 MED ORDER — DEXMEDETOMIDINE (PRECEDEX) IN NS 20 MCG/5ML (4 MCG/ML) IV SYRINGE
PREFILLED_SYRINGE | INTRAVENOUS | Status: DC | PRN
  Administered 2021-08-21: 8 ug via INTRAVENOUS
  Administered 2021-08-21: 12 ug via INTRAVENOUS

## 2021-08-21 MED ORDER — HYDROCODONE-ACETAMINOPHEN 5-325 MG PO TABS: 1.0000 | ORAL_TABLET | ORAL | Status: DC | PRN

## 2021-08-21 MED ORDER — LACTATED RINGERS IR SOLN
Status: DC | PRN
  Administered 2021-08-21 (×4): 3000 mL

## 2021-08-21 MED ORDER — MIDAZOLAM HCL 2 MG/2ML IJ SOLN
INTRAMUSCULAR | Status: AC
  Filled 2021-08-21: qty 2

## 2021-08-21 MED ORDER — FENTANYL CITRATE (PF) 100 MCG/2ML IJ SOLN
INTRAMUSCULAR | Status: AC
Start: 2021-08-21 — End: ?
  Filled 2021-08-21: qty 2

## 2021-08-21 MED ORDER — EPHEDRINE SULFATE (PRESSORS) 50 MG/ML IJ SOLN
INTRAMUSCULAR | Status: DC | PRN
  Administered 2021-08-21: 10 mg via INTRAVENOUS
  Administered 2021-08-21: 5 mg via INTRAVENOUS
  Administered 2021-08-21: 10 mg via INTRAVENOUS

## 2021-08-21 MED ORDER — FENTANYL CITRATE (PF) 100 MCG/2ML IJ SOLN: 25.0000 ug | INTRAMUSCULAR | Status: DC | PRN

## 2021-08-21 MED ORDER — ACETAMINOPHEN 10 MG/ML IV SOLN
INTRAVENOUS | Status: AC
  Filled 2021-08-21: qty 100

## 2021-08-21 MED ORDER — OXYCODONE HCL 5 MG PO TABS
5.0000 mg | ORAL_TABLET | Freq: Once | ORAL | Status: AC | PRN
  Administered 2021-08-21: 5 mg via ORAL

## 2021-08-21 MED ORDER — FAMOTIDINE 20 MG PO TABS
ORAL_TABLET | ORAL | Status: AC
  Filled 2021-08-21: qty 1

## 2021-08-21 MED ORDER — MIDAZOLAM HCL 2 MG/2ML IJ SOLN
INTRAMUSCULAR | Status: DC | PRN
  Administered 2021-08-21: 2 mg via INTRAVENOUS

## 2021-08-21 MED ORDER — METOCLOPRAMIDE HCL 10 MG PO TABS: 5.0000 mg | ORAL_TABLET | Freq: Three times a day (TID) | ORAL | Status: DC | PRN

## 2021-08-21 SURGICAL SUPPLY — 29 items
ADAPTER IRRIG TUBE 2 SPIKE SOL (ADAPTER) ×4 IMPLANT
BLADE SHAVER 4.5 DBL SERAT CV (CUTTER) IMPLANT
BNDG ELASTIC 6X5.8 VLCR NS LF (GAUZE/BANDAGES/DRESSINGS) ×2 IMPLANT
DRAPE ARTHRO LIMB 89X125 STRL (DRAPES) ×2 IMPLANT
DRSG DERMACEA 8X12 NADH (GAUZE/BANDAGES/DRESSINGS) ×2 IMPLANT
DURAPREP 26ML APPLICATOR (WOUND CARE) ×2 IMPLANT
GAUZE SPONGE 4X4 12PLY STRL (GAUZE/BANDAGES/DRESSINGS) ×2 IMPLANT
GLOVE BIOGEL M STRL SZ7.5 (GLOVE) ×2 IMPLANT
GLOVE SURG UNDER LTX SZ8 (GLOVE) ×2 IMPLANT
GOWN STRL REUS W/ TWL LRG LVL3 (GOWN DISPOSABLE) ×2 IMPLANT
GOWN STRL REUS W/TWL LRG LVL3 (GOWN DISPOSABLE) ×2
IV LACTATED RINGER IRRG 3000ML (IV SOLUTION) ×4
IV LR IRRIG 3000ML ARTHROMATIC (IV SOLUTION) ×2 IMPLANT
KIT TURNOVER KIT A (KITS) ×2 IMPLANT
MANIFOLD NEPTUNE II (INSTRUMENTS) ×2 IMPLANT
PACK ARTHROSCOPY KNEE (MISCELLANEOUS) ×2 IMPLANT
PADDING CAST 6X4YD NS (MISCELLANEOUS) ×1
PADDING CAST COTTON 6X4 NS (MISCELLANEOUS) ×1 IMPLANT
SHAVER BLADE TAPERED BLUNT 4 (BLADE) ×2 IMPLANT
SOL PREP PVP 2OZ (MISCELLANEOUS) ×2
SOLUTION PREP PVP 2OZ (MISCELLANEOUS) ×1 IMPLANT
SPONGE T-LAP 18X18 ~~LOC~~+RFID (SPONGE) ×2 IMPLANT
SUT ETHILON 3-0 FS-10 30 BLK (SUTURE) ×2
SUTURE EHLN 3-0 FS-10 30 BLK (SUTURE) ×1 IMPLANT
TUBING INFLOW SET DBFLO PUMP (TUBING) ×2 IMPLANT
TUBING OUTFLOW SET DBLFO PUMP (TUBING) ×2 IMPLANT
WAND HAND CNTRL MULTIVAC 50 (MISCELLANEOUS) ×2 IMPLANT
WATER STERILE IRR 500ML POUR (IV SOLUTION) ×2 IMPLANT
WRAP KNEE W/COLD PACKS 25.5X14 (SOFTGOODS) ×2 IMPLANT

## 2021-08-21 NOTE — H&P (Signed)
The patient has been re-examined, and the chart reviewed, and there have been no interval changes to the documented history and physical.    The risks, benefits, and alternatives have been discussed at length. The patient expressed understanding of the risks benefits and agreed with plans for surgical intervention.  Ziad Maye P. Jaice Digioia, Jr. M.D.    

## 2021-08-21 NOTE — Transfer of Care (Signed)
Immediate Anesthesia Transfer of Care Note ? ?Patient: Valerie Archer ? ?Procedure(s) Performed: ARTHROSCOPY KNEE, medial menisectomy (Right: Knee) ? ?Patient Location: PACU ? ?Anesthesia Type:General ? ?Level of Consciousness: drowsy ? ?Airway & Oxygen Therapy: Patient Spontanous Breathing and Patient connected to nasal cannula oxygen ? ?Post-op Assessment: Report given to RN ? ?Post vital signs: stable ? ?Last Vitals:  ?Vitals Value Taken Time  ?BP 146/85 08/21/21 1711  ?Temp    ?Pulse 97 08/21/21 1713  ?Resp 20 08/21/21 1713  ?SpO2 98 % 08/21/21 1713  ?Vitals shown include unvalidated device data. ? ?Last Pain:  ?Vitals:  ? 08/21/21 1414  ?TempSrc: Temporal  ?PainSc: 0-No pain  ?   ? ?  ? ?Complications: No notable events documented. ?

## 2021-08-21 NOTE — Anesthesia Preprocedure Evaluation (Signed)
Anesthesia Evaluation  ?Patient identified by MRN, date of birth, ID band ?Patient awake ? ? ? ?Reviewed: ?Allergy & Precautions, NPO status , Patient's Chart, lab work & pertinent test results ? ?History of Anesthesia Complications ?Negative for: history of anesthetic complications ? ?Airway ?Mallampati: III ? ?TM Distance: <3 FB ?Neck ROM: full ? ? ? Dental ? ?(+) Chipped ?  ?Pulmonary ?neg pulmonary ROS, neg shortness of breath,  ?  ?Pulmonary exam normal ? ? ? ? ? ? ? Cardiovascular ?Exercise Tolerance: Good ?(-) angina(-) Past MI and (-) DOE negative cardio ROS ?Normal cardiovascular exam ? ? ?  ?Neuro/Psych ?PSYCHIATRIC DISORDERS negative neurological ROS ?   ? GI/Hepatic ?negative GI ROS, Neg liver ROS, neg GERD  ,  ?Endo/Other  ?Hypothyroidism  ? Renal/GU ?  ? ?  ?Musculoskeletal ? ? Abdominal ?  ?Peds ? Hematology ?negative hematology ROS ?(+)   ?Anesthesia Other Findings ?Past Medical History: ?No date: ADHD (attention deficit hyperactivity disorder) ?No date: Anxiety ?No date: Depression ?No date: History of kidney stones ?No date: Hypothyroidism ?No date: Pre-diabetes ?No date: Thyroid disease ? ?Past Surgical History: ?No date: ABDOMINAL HYSTERECTOMY ?No date: CESAREAN SECTION ?No date: COLONOSCOPY W/ POLYPECTOMY ? ?BMI   ? Body Mass Index: 32.45 kg/m?  ?  ? ? Reproductive/Obstetrics ?negative OB ROS ? ?  ? ? ? ? ? ? ? ? ? ? ? ? ? ?  ?  ? ? ? ? ? ? ? ? ?Anesthesia Physical ?Anesthesia Plan ? ?ASA: 3 ? ?Anesthesia Plan: General LMA  ? ?Post-op Pain Management:   ? ?Induction: Intravenous ? ?PONV Risk Score and Plan: Dexamethasone, Ondansetron, Midazolam and Treatment may vary due to age or medical condition ? ?Airway Management Planned: LMA ? ?Additional Equipment:  ? ?Intra-op Plan:  ? ?Post-operative Plan: Extubation in OR ? ?Informed Consent: I have reviewed the patients History and Physical, chart, labs and discussed the procedure including the risks, benefits and  alternatives for the proposed anesthesia with the patient or authorized representative who has indicated his/her understanding and acceptance.  ? ? ? ?Dental Advisory Given ? ?Plan Discussed with: Anesthesiologist, CRNA and Surgeon ? ?Anesthesia Plan Comments: (Patient consented for risks of anesthesia including but not limited to:  ?- adverse reactions to medications ?- damage to eyes, teeth, lips or other oral mucosa ?- nerve damage due to positioning  ?- sore throat or hoarseness ?- Damage to heart, brain, nerves, lungs, other parts of body or loss of life ? ?Patient voiced understanding.)  ? ? ? ? ? ? ?Anesthesia Quick Evaluation ? ?

## 2021-08-21 NOTE — Op Note (Signed)
OPERATIVE NOTE ? ?DATE OF SURGERY:  08/21/2021 ? ?PATIENT NAME:  Valerie Archer   ?DOB: [birth year]  ?MRN: [id number] ? ? ?PRE-OPERATIVE DIAGNOSIS:  Internal derangement of the right knee  ? ?POST-OPERATIVE DIAGNOSIS: Complex tear of the posterior horn of the medial meniscus, right knee ? ?PROCEDURE:  Right knee arthroscopy, partial medial meniscectomy ? ?SURGEON:  Jena Gauss., M.D.  ? ?ASSISTANT: none ? ?ANESTHESIA: general ? ?ESTIMATED BLOOD LOSS: Minimal ? ?FLUIDS REPLACED: 700 mL of crystalloid ? ?TOURNIQUET TIME: Not used ? ?INDICATIONS FOR SURGERY: Valerie Archer is a 56 y.o. year old female who has been seen for complaints of right knee pain. MRI demonstrated findings consistent with meniscal pathology. After discussion of the risks and benefits of surgical intervention, the patient expressed understanding of the risks benefits and agree with plans for right knee arthroscopy.  ? ?PROCEDURE IN DETAIL: The patient was brought into the operating room and, after adequate general anesthesia was achieved, a tourniquet was applied to the right thigh and the leg was placed in the leg holder. All bony prominences were well padded. The patient's right knee was cleaned and prepped with alcohol and Duraprep and draped in the usual sterile fashion. A "timeout" was performed as per usual protocol. The anticipated portal sites were injected with 0.25% Marcaine with epinephrine. An anterolateral incision was made and a cannula was inserted. A small effusion was evacuated and the knee was distended with fluid using the pump. The scope was advanced down the medial gutter into the medial compartment. Under visualization with the scope, an anteromedial portal was created and a hooked probe was inserted. The medial meniscus was visualized and probed.  There was a complex tear of the posterior horn of the medial meniscus with a flap type component.  The tear was debrided using meniscal punches and a incisor shaver.  Final  contouring was performed using the 50 degree ArthroCare wand.  The remaining rim of meniscus was visualized and probed and felt to be stable.  The articular cartilage was visualized and noted to be in excellent condition. ? ?The scope was then advanced into the intercondylar notch. The anterior cruciate ligament was visualized and probed and felt to be intact. The scope was removed from the lateral portal and reinserted via the anteromedial portal to better visualize the lateral compartment. The lateral meniscus was visualized and probed.  The lateral meniscus was intact without evidence of tear or instability.  The articular cartilage of the lateral compartment was visualized and noted to be in excellent condition.  Finally, the scope was advanced so as to visualize the patellofemoral articulation. Good patellar tracking was appreciated.  Only mild fraying was noted to the articular surface. ? ?The knee was irrigated with copius amounts of fluid and suctioned dry. The anterolateral portal was re-approximated with #3-0 nylon. A combination of 0.25% Marcaine with epinephrine and 4 mg of Morphine were injected via the scope. The scope was removed and the anteromedial portal was re-approximated with #3-0 nylon. A sterile dressing was applied followed by application of an ice wrap. ? ?The patient tolerated the procedure well and was transported to the PACU in stable condition. ? ?Taletha Twiford P. Angie Fava., M.D. ? ?

## 2021-08-21 NOTE — Anesthesia Procedure Notes (Signed)
Procedure Name: Intubation ?Date/Time: 08/21/2021 3:41 PM ?Performed by: Jaye Beagle, CRNA ?Pre-anesthesia Checklist: Patient identified, Emergency Drugs available, Suction available and Patient being monitored ?Patient Re-evaluated:Patient Re-evaluated prior to induction ?Oxygen Delivery Method: Circle system utilized ?Preoxygenation: Pre-oxygenation with 100% oxygen ?Induction Type: IV induction ?Ventilation: Mask ventilation without difficulty ?LMA: LMA inserted ?LMA Size: 4.0 ?Number of attempts: 1 ?Placement Confirmation: positive ETCO2 and breath sounds checked- equal and bilateral ?Tube secured with: Tape ?Dental Injury: Teeth and Oropharynx as per pre-operative assessment  ? ? ? ? ?

## 2021-08-22 ENCOUNTER — Encounter: Payer: Self-pay | Admitting: Orthopedic Surgery

## 2021-08-28 NOTE — Anesthesia Postprocedure Evaluation (Signed)
Anesthesia Post Note ? ?Patient: Valerie Archer ? ?Procedure(s) Performed: ARTHROSCOPY KNEE, medial menisectomy (Right: Knee) ? ?Patient location during evaluation: PACU ?Anesthesia Type: General ?Level of consciousness: awake and alert ?Pain management: pain level controlled ?Vital Signs Assessment: post-procedure vital signs reviewed and stable ?Respiratory status: spontaneous breathing, nonlabored ventilation, respiratory function stable and patient connected to nasal cannula oxygen ?Cardiovascular status: blood pressure returned to baseline and stable ?Postop Assessment: no apparent nausea or vomiting ?Anesthetic complications: no ? ? ?No notable events documented. ? ? ?Last Vitals:  ?Vitals:  ? 08/21/21 1745 08/21/21 1808  ?BP: 129/77 128/62  ?Pulse: 85 86  ?Resp: 16 20  ?Temp: (!) 36.4 ?C 36.5 ?C  ?SpO2: 95%   ?  ?Last Pain:  ?Vitals:  ? 08/21/21 1808  ?TempSrc: Temporal  ?PainSc: 4   ? ? ?  ?  ?  ?  ?  ?  ? ?Yevette Edwards ? ? ? ? ?

## 2021-10-17 LAB — HM PAP SMEAR

## 2021-10-17 LAB — RESULTS CONSOLE HPV: CHL HPV: NEGATIVE

## 2021-10-17 LAB — OB RESULTS CONSOLE GC/CHLAMYDIA: Chlamydia: NEGATIVE

## 2022-01-17 LAB — HEPATIC FUNCTION PANEL
ALT: 18 U/L (ref 7–35)
AST: 22 (ref 13–35)
Alkaline Phosphatase: 107 (ref 25–125)
Bilirubin, Total: 0.9

## 2022-01-17 LAB — VITAMIN B12: Vitamin B-12: 313

## 2022-01-17 LAB — HM MAMMOGRAPHY

## 2022-01-17 LAB — LIPID PANEL
Cholesterol: 126 (ref 0–200)
HDL: 57 (ref 35–70)
LDL Cholesterol: 48
Triglycerides: 122 (ref 40–160)

## 2022-01-17 LAB — COMPREHENSIVE METABOLIC PANEL
Albumin: 4.5 (ref 3.5–5.0)
Calcium: 9.4 (ref 8.7–10.7)
Globulin: 2.6
eGFR: 85

## 2022-01-17 LAB — BASIC METABOLIC PANEL
BUN: 13 (ref 4–21)
CO2: 22 (ref 13–22)
Chloride: 106 (ref 99–108)
Creatinine: 0.8 (ref 0.5–1.1)
Glucose: 82
Potassium: 4.6 mEq/L (ref 3.5–5.1)
Sodium: 145 (ref 137–147)

## 2022-01-17 LAB — TSH: TSH: 2.08 (ref 0.41–5.90)

## 2022-01-17 LAB — HEMOGLOBIN A1C: Hemoglobin A1C: 6

## 2022-01-17 LAB — CBC AND DIFFERENTIAL
HCT: 40 (ref 36–46)
Hemoglobin: 13.4 (ref 12.0–16.0)
Neutrophils Absolute: 2.3
Platelets: 321 10*3/uL (ref 150–400)
WBC: 5

## 2022-01-17 LAB — CBC: RBC: 4.58 (ref 3.87–5.11)

## 2022-05-17 ENCOUNTER — Encounter: Payer: Self-pay | Admitting: Family

## 2022-05-19 ENCOUNTER — Encounter: Payer: Self-pay | Admitting: Family

## 2022-05-20 ENCOUNTER — Encounter: Payer: Self-pay | Admitting: Family

## 2022-05-20 ENCOUNTER — Ambulatory Visit (INDEPENDENT_AMBULATORY_CARE_PROVIDER_SITE_OTHER): Payer: BC Managed Care – PPO | Admitting: Family

## 2022-05-20 VITALS — BP 132/72 | HR 84 | Ht 65.0 in | Wt 190.2 lb

## 2022-05-20 DIAGNOSIS — E538 Deficiency of other specified B group vitamins: Secondary | ICD-10-CM | POA: Diagnosis not present

## 2022-05-20 DIAGNOSIS — E782 Mixed hyperlipidemia: Secondary | ICD-10-CM

## 2022-05-20 DIAGNOSIS — Z113 Encounter for screening for infections with a predominantly sexual mode of transmission: Secondary | ICD-10-CM

## 2022-05-20 DIAGNOSIS — R7303 Prediabetes: Secondary | ICD-10-CM

## 2022-05-20 DIAGNOSIS — E039 Hypothyroidism, unspecified: Secondary | ICD-10-CM

## 2022-05-20 DIAGNOSIS — Z1159 Encounter for screening for other viral diseases: Secondary | ICD-10-CM

## 2022-05-20 DIAGNOSIS — E559 Vitamin D deficiency, unspecified: Secondary | ICD-10-CM | POA: Insufficient documentation

## 2022-05-20 DIAGNOSIS — I10 Essential (primary) hypertension: Secondary | ICD-10-CM | POA: Diagnosis not present

## 2022-05-20 DIAGNOSIS — Z780 Asymptomatic menopausal state: Secondary | ICD-10-CM | POA: Insufficient documentation

## 2022-05-20 MED ORDER — SEMAGLUTIDE(0.25 OR 0.5MG/DOS) 2 MG/3ML ~~LOC~~ SOPN: PEN_INJECTOR | SUBCUTANEOUS | 0 refills | Status: AC

## 2022-05-20 NOTE — Patient Instructions (Signed)
Starting Ozempic as above.   Continue current other meds POC reviewed and agreed to.   Follow up in 1 month.

## 2022-05-20 NOTE — Progress Notes (Signed)
Established Patient Office Visit  2 Patient ID: Valerie Archer, female    DOB: June 18, 1965  Age: 57 y.o. MRN: [id number]  Chief Complaint  Patient presents with   Follow-up    4 month follow up    HPI  Patient Active Problem List   Diagnosis Date Noted   Mixed hyperlipidemia 05/20/2022   Vitamin D deficiency, unspecified 05/20/2022   Prediabetes 05/20/2022   Essential hypertension, benign 05/20/2022   Hypothyroidism (acquired) 05/20/2022   Menopause 05/20/2022   No Known Allergies    Review of Systems  All other systems reviewed and are negative.     Objective:     BP 132/72 (BP Location: Right Arm)   Pulse 84   Ht 5\' 5"  (1.651 m)   Wt 190 lb 3.2 oz (86.3 kg)   SpO2 96%   BMI 31.65 kg/m  BP Readings from Last 3 Encounters:  05/20/22 132/72  01/17/22 132/78  08/21/21 128/62   Wt Readings from Last 3 Encounters:  05/20/22 190 lb 3.2 oz (86.3 kg)  01/17/22 186 lb 9.6 oz (84.6 kg)  08/21/21 195 lb (88.5 kg)      Physical Exam Vitals and nursing note reviewed.  Constitutional:      Appearance: Normal appearance. She is obese.  HENT:     Head: Normocephalic.  Eyes:     Pupils: Pupils are equal, round, and reactive to light.  Cardiovascular:     Rate and Rhythm: Normal rate and regular rhythm.  Pulmonary:     Effort: Pulmonary effort is normal.  Neurological:     Mental Status: She is alert.      No results found for any visits on 05/20/22.  Last CBC Lab Results  Component Value Date   WBC 5.0 01/17/2022   HGB 13.4 01/17/2022   HCT 40 01/17/2022   PLT 321 93/26/7124   Last metabolic panel Lab Results  Component Value Date   NA 145 01/17/2022   K 4.6 01/17/2022   CL 106 01/17/2022   CO2 22 01/17/2022   BUN 13 01/17/2022   CREATININE 0.8 01/17/2022   EGFR 85 01/17/2022   CALCIUM 9.4 01/17/2022   ALBUMIN 4.5 01/17/2022   ALKPHOS 107 01/17/2022   AST 22 01/17/2022   ALT 18 01/17/2022   Last lipids Lab Results  Component Value Date    CHOL 126 01/17/2022   HDL 57 01/17/2022   LDLCALC 48 01/17/2022   TRIG 122 01/17/2022   Last hemoglobin A1c Lab Results  Component Value Date   HGBA1C 6.0 01/17/2022   Last thyroid functions Lab Results  Component Value Date   TSH 2.08 01/17/2022   Last vitamin D No results found for: "25OHVITD2", "25OHVITD3", "VD25OH" Last vitamin B12 and Folate Lab Results  Component Value Date   VITAMINB12 313 01/17/2022      The ASCVD Risk score (Arnett DK, et al., 2019) failed to calculate for the following reasons:   The valid total cholesterol range is 130 to 320 mg/dL    Assessment & Plan:   Problem List Items Addressed This Visit     Mixed hyperlipidemia - Primary   Relevant Orders   Lipid panel   Vitamin D deficiency, unspecified   Relevant Orders   VITAMIN D 25 Hydroxy (Vit-D Deficiency, Fractures)   Prediabetes   Relevant Medications   Semaglutide,0.25 or 0.5MG /DOS, 2 MG/3ML SOPN   Other Relevant Orders   CBC With Differential   CMP14+EGFR   Hemoglobin A1c   Essential  hypertension, benign   Hypothyroidism (acquired)   Relevant Orders   CBC With Differential   CMP14+EGFR   TSH   Menopause   Relevant Orders   Anti mullerian hormone   Other Visit Diagnoses     B12 deficiency due to diet       Need for hepatitis C screening test       Relevant Orders   Hepatitis C Ab reflex to Quant PCR   Encounter for screening examination for sexually transmitted disease       Relevant Orders   HIV antibody (with reflex)       Return in about 4 weeks (around 06/17/2022).    [employer], FNP

## 2022-05-24 LAB — LIPID PANEL
Chol/HDL Ratio: 2.5 ratio (ref 0.0–4.4)
Cholesterol, Total: 143 mg/dL (ref 100–199)
HDL: 58 mg/dL (ref >39)
LDL Chol Calc (NIH): 59 mg/dL (ref 0–99)
Triglycerides: 157 mg/dL — ABNORMAL HIGH (ref 0–149)
VLDL Cholesterol Cal: 26 mg/dL (ref 5–40)

## 2022-05-24 LAB — TSH: TSH: 3.12 u[IU]/mL (ref 0.450–4.500)

## 2022-05-24 LAB — CBC WITH DIFFERENTIAL
Basophils Absolute: 0.1 10*3/uL (ref 0.0–0.2)
Basos: 1 %
EOS (ABSOLUTE): 0.1 10*3/uL (ref 0.0–0.4)
Eos: 1 %
Hematocrit: 39.9 % (ref 34.0–46.6)
Hemoglobin: 13.4 g/dL (ref 11.1–15.9)
Immature Grans (Abs): 0 10*3/uL (ref 0.0–0.1)
Immature Granulocytes: 0 %
Lymphocytes Absolute: 2 10*3/uL (ref 0.7–3.1)
Lymphs: 40 %
MCH: 29.7 pg (ref 26.6–33.0)
MCHC: 33.6 g/dL (ref 31.5–35.7)
MCV: 89 fL (ref 79–97)
Monocytes Absolute: 0.3 10*3/uL (ref 0.1–0.9)
Monocytes: 6 %
Neutrophils Absolute: 2.5 10*3/uL (ref 1.4–7.0)
Neutrophils: 52 %
RBC: 4.51 x10E6/uL (ref 3.77–5.28)
RDW: 13.1 % (ref 11.7–15.4)
WBC: 5 10*3/uL (ref 3.4–10.8)

## 2022-05-24 LAB — HEMOGLOBIN A1C
Est. average glucose Bld gHb Est-mCnc: 131 mg/dL
Hgb A1c MFr Bld: 6.2 % — ABNORMAL HIGH (ref 4.8–5.6)

## 2022-05-24 LAB — CMP14+EGFR
ALT: 20 IU/L (ref 0–32)
AST: 20 IU/L (ref 0–40)
Albumin/Globulin Ratio: 1.9 (ref 1.2–2.2)
Albumin: 4.5 g/dL (ref 3.8–4.9)
Alkaline Phosphatase: 111 IU/L (ref 44–121)
BUN/Creatinine Ratio: 17 (ref 9–23)
BUN: 15 mg/dL (ref 6–24)
Bilirubin Total: 0.9 mg/dL (ref 0.0–1.2)
CO2: 25 mmol/L (ref 20–29)
Calcium: 9.6 mg/dL (ref 8.7–10.2)
Chloride: 104 mmol/L (ref 96–106)
Creatinine, Ser: 0.88 mg/dL (ref 0.57–1.00)
Globulin, Total: 2.4 g/dL (ref 1.5–4.5)
Glucose: 88 mg/dL (ref 70–99)
Potassium: 4.3 mmol/L (ref 3.5–5.2)
Sodium: 142 mmol/L (ref 134–144)
Total Protein: 6.9 g/dL (ref 6.0–8.5)
eGFR: 77 mL/min/{1.73_m2} (ref >59)

## 2022-05-24 LAB — HCV INTERPRETATION

## 2022-05-24 LAB — HIV ANTIBODY (ROUTINE TESTING W REFLEX): HIV Screen 4th Generation wRfx: NONREACTIVE

## 2022-05-24 LAB — VITAMIN D 25 HYDROXY (VIT D DEFICIENCY, FRACTURES): Vit D, 25-Hydroxy: 24.7 ng/mL — ABNORMAL LOW (ref 30.0–100.0)

## 2022-05-24 LAB — HCV AB W REFLEX TO QUANT PCR: HCV Ab: NONREACTIVE

## 2022-05-24 LAB — ANTI MULLERIAN HORMONE: ANTI-MULLERIAN HORMONE (AMH): <0.015 ng/mL

## 2022-06-17 ENCOUNTER — Ambulatory Visit (INDEPENDENT_AMBULATORY_CARE_PROVIDER_SITE_OTHER): Payer: BC Managed Care – PPO | Admitting: Family

## 2022-06-17 ENCOUNTER — Encounter: Payer: Self-pay | Admitting: Family

## 2022-06-17 VITALS — BP 118/74 | HR 82 | Ht 64.0 in | Wt 183.2 lb

## 2022-06-17 DIAGNOSIS — R7303 Prediabetes: Secondary | ICD-10-CM | POA: Diagnosis not present

## 2022-06-17 DIAGNOSIS — Z780 Asymptomatic menopausal state: Secondary | ICD-10-CM

## 2022-06-17 DIAGNOSIS — Z6831 Body mass index (BMI) 31.0-31.9, adult: Secondary | ICD-10-CM | POA: Insufficient documentation

## 2022-06-17 NOTE — Assessment & Plan Note (Signed)
Continue current meds, patient given additional sample of Ozempic.  Will send RX for the '1mg'$  dose for her.   Will follow up in 2 months for recheck.

## 2022-06-17 NOTE — Progress Notes (Signed)
Established Patient Office Visit  Subjective:  Patient ID: Valerie Archer, female    DOB: Aug 06, 1965  Age: 57 y.o. MRN: [id number]  Chief Complaint  Patient presents with   Follow-up    4 week follow up    Pt here for her 1 month f/u.  She has been doing well with the Ozempic, says that she has not had any negative side effects.   No other concerns today.      Past Medical History:  Diagnosis Date   ADHD (attention deficit hyperactivity disorder)    Anxiety    Depression    History of kidney stones    Hypothyroidism    Pre-diabetes    Thyroid disease     Social History   Socioeconomic History   Marital status: Married    Spouse name: Not on file   Number of children: Not on file   Years of education: Not on file   Highest education level: Not on file  Occupational History   Not on file  Tobacco Use   Smoking status: Never   Smokeless tobacco: Never  Substance and Sexual Activity   Alcohol use: Yes    Comment: occasionally   Drug use: Never   Sexual activity: Not on file  Other Topics Concern   Not on file  Social History Narrative   Lives alone   Social Determinants of Health   Financial Resource Strain: Not on file  Food Insecurity: Not on file  Transportation Needs: Not on file  Physical Activity: Not on file  Stress: Not on file  Social Connections: Not on file  Intimate Partner Violence: Not on file    Family History  Problem Relation Age of Onset   Breast cancer Cousin        mat cousin   Bladder Cancer Mother    Bladder Cancer Maternal Uncle     No Known Allergies  Review of Systems  Genitourinary:        Vaginal dryness  All other systems reviewed and are negative.      Objective:   BP 118/74   Pulse 82   Ht '5\' 4"'$  (1.626 m)   Wt 183 lb 3.2 oz (83.1 kg)   SpO2 98%   BMI 31.45 kg/m   Vitals:   06/17/22 1006  BP: 118/74  Pulse: 82  Height: '5\' 4"'$  (1.626 m)  Weight: 183 lb 3.2 oz (83.1 kg)  SpO2: 98%  BMI  (Calculated): 31.43    Physical Exam Vitals and nursing note reviewed.  Constitutional:      Appearance: Normal appearance. She is normal weight.  HENT:     Head: Normocephalic and atraumatic.  Eyes:     Extraocular Movements: Extraocular movements intact.     Pupils: Pupils are equal, round, and reactive to light.  Cardiovascular:     Rate and Rhythm: Normal rate and regular rhythm.     Pulses: Normal pulses.     Heart sounds: Normal heart sounds.  Pulmonary:     Effort: Pulmonary effort is normal.  Musculoskeletal:        General: Normal range of motion.  Neurological:     General: No focal deficit present.     Mental Status: She is alert and oriented to person, place, and time.      No results found for any visits on 06/17/22.  Recent Results (from the past 2160 hour(s))  Lipid panel     Status: Abnormal   Collection Time:  05/20/22 10:46 AM  Result Value Ref Range   Cholesterol, Total 143 100 - 199 mg/dL   Triglycerides 157 (H) 0 - 149 mg/dL   HDL 58 >39 mg/dL   VLDL Cholesterol Cal 26 5 - 40 mg/dL   LDL Chol Calc (NIH) 59 0 - 99 mg/dL   Chol/HDL Ratio 2.5 0.0 - 4.4 ratio    Comment:                                   T. Chol/HDL Ratio                                             Men  Women                               1/2 Avg.Risk  3.4    3.3                                   Avg.Risk  5.0    4.4                                2X Avg.Risk  9.6    7.1                                3X Avg.Risk 23.4   11.0   VITAMIN D 25 Hydroxy (Vit-D Deficiency, Fractures)     Status: Abnormal   Collection Time: 05/20/22 10:46 AM  Result Value Ref Range   Vit D, 25-Hydroxy 24.7 (L) 30.0 - 100.0 ng/mL    Comment: Vitamin D deficiency has been defined by the [place] practice guideline as a level of serum 25-OH vitamin D less than 20 ng/mL (1,2). The Endocrine Society went on to further define vitamin D insufficiency as a level between 21  and 29 ng/mL (2). 1. IOM (Institute of Medicine). 2010. Dietary reference    intakes for calcium and D. [place]: The    [employer]. 2. Holick MF, Binkley [place], Bischoff-Ferrari HA, et al.    Evaluation, treatment, and prevention of vitamin D    deficiency: an Endocrine Society clinical practice    guideline. JCEM. 2011 Jul; 96(7):1911-30.   CBC With Differential     Status: None   Collection Time: 05/20/22 10:46 AM  Result Value Ref Range   WBC 5.0 3.4 - 10.8 x10E3/uL   RBC 4.51 3.77 - 5.28 x10E6/uL   Hemoglobin 13.4 11.1 - 15.9 g/dL   Hematocrit 39.9 34.0 - 46.6 %   MCV 89 79 - 97 fL   MCH 29.7 26.6 - 33.0 pg   MCHC 33.6 31.5 - 35.7 g/dL   RDW 13.1 11.7 - 15.4 %   Neutrophils 52 Not Estab. %   Lymphs 40 Not Estab. %   Monocytes 6 Not Estab. %   Eos 1 Not Estab. %   Basos 1 Not Estab. %   Neutrophils Absolute 2.5 1.4 - 7.0 x10E3/uL   Lymphocytes Absolute 2.0 0.7 - 3.1 x10E3/uL  Monocytes Absolute 0.3 0.1 - 0.9 x10E3/uL   EOS (ABSOLUTE) 0.1 0.0 - 0.4 x10E3/uL   Basophils Absolute 0.1 0.0 - 0.2 x10E3/uL   Immature Granulocytes 0 Not Estab. %   Immature Grans (Abs) 0.0 0.0 - 0.1 x10E3/uL  CMP14+EGFR     Status: None   Collection Time: 05/20/22 10:46 AM  Result Value Ref Range   Glucose 88 70 - 99 mg/dL   BUN 15 6 - 24 mg/dL   Creatinine, Ser 0.88 0.57 - 1.00 mg/dL   eGFR 77 >59 mL/min/1.73   BUN/Creatinine Ratio 17 9 - 23   Sodium 142 134 - 144 mmol/L   Potassium 4.3 3.5 - 5.2 mmol/L   Chloride 104 96 - 106 mmol/L   CO2 25 20 - 29 mmol/L   Calcium 9.6 8.7 - 10.2 mg/dL   Total Protein 6.9 6.0 - 8.5 g/dL   Albumin 4.5 3.8 - 4.9 g/dL   Globulin, Total 2.4 1.5 - 4.5 g/dL   Albumin/Globulin Ratio 1.9 1.2 - 2.2   Bilirubin Total 0.9 0.0 - 1.2 mg/dL   Alkaline Phosphatase 111 44 - 121 IU/L   AST 20 0 - 40 IU/L   ALT 20 0 - 32 IU/L  TSH     Status: None   Collection Time: 05/20/22 10:46 AM  Result Value Ref Range   TSH 3.120 0.450 - 4.500 uIU/mL   Hemoglobin A1c     Status: Abnormal   Collection Time: 05/20/22 10:46 AM  Result Value Ref Range   Hgb A1c MFr Bld 6.2 (H) 4.8 - 5.6 %    Comment:          Prediabetes: 5.7 - 6.4          Diabetes: >6.4          Glycemic control for adults with diabetes: <7.0    Est. average glucose Bld gHb Est-mCnc 131 mg/dL  Anti mullerian hormone     Status: None   Collection Time: 05/20/22 10:46 AM  Result Value Ref Range   ANTI-MULLERIAN HORMONE (AMH) <0.015 ng/mL    Comment: For assays employing antibodies, the possibility exists for interference by heterophile antibodies in the samples.1  1.Kricka L.  Interferences in Immunoassays - still a threat.  Clin. Chem. 2000; [id number].  This test was developed and its performance characteristics determined by [facility]. It has not been cleared or approved by the Food and Drug Administration. Reference Range: Females 55 - 97y:  <=  0.18 Median  <0.03 Females at risk of ovarian hyperstimulation syndrome or polycystic ovarian syndrome (PCOS) may exhibit elevated serum AMH concentrations.   AMH levels from PCOS patients may be 2 to 5 fold higher than age-appropriate reference interval values. Granulosa cell tumors of the ovary may secrete AMH along with other tumor markers.  Elevated AMH is not specific for malignancy, and the assay should not be used exclusively to diagnose or exclude an AMH-secreting ovarian tumor.   Hepatitis C Ab reflex to Quant PCR     Status: None   Collection Time: 05/20/22 10:46 AM  Result Value Ref Range   HCV Ab Non Reactive Non Reactive  HIV antibody (with reflex)     Status: None   Collection Time: 05/20/22 10:46 AM  Result Value Ref Range   HIV Screen 4th Generation wRfx Non Reactive Non Reactive    Comment: HIV Negative HIV-1/HIV-2 antibodies and HIV-1 p24 antigen were NOT detected. There is no laboratory evidence of HIV infection.   Interpretation:  Status: None   Collection Time: 05/20/22 10:46 AM   Result Value Ref Range   HCV Interp 1: Comment     Comment: Not infected with HCV unless early or acute infection is suspected (which may be delayed in an immunocompromised individual), or other evidence exists to indicate HCV infection.       Assessment & Plan:   Problem List Items Addressed This Visit     Prediabetes - Primary (Chronic)    Continue current meds, patient given additional sample of Ozempic.  Will send RX for the '1mg'$  dose for her.   Will follow up in 2 months for recheck.       Menopause   BMI 31.0-31.9,adult    Continue current meds, patient given additional sample of Ozempic.  Will send RX for the '1mg'$  dose for her.   Will follow up in 2 months for recheck.        Return in about 2 months (around 08/17/2022) for F/U.   Total time spent: 20 minutes  Mechele Claude, FNP  06/17/2022

## 2022-08-17 ENCOUNTER — Other Ambulatory Visit: Payer: Self-pay | Admitting: Family

## 2022-08-18 ENCOUNTER — Other Ambulatory Visit: Payer: Self-pay | Admitting: Family

## 2022-08-20 ENCOUNTER — Ambulatory Visit: Payer: BC Managed Care – PPO | Admitting: Family

## 2022-11-06 ENCOUNTER — Encounter: Payer: Self-pay | Admitting: Family

## 2022-11-06 ENCOUNTER — Ambulatory Visit (INDEPENDENT_AMBULATORY_CARE_PROVIDER_SITE_OTHER): Payer: BC Managed Care – PPO | Admitting: Family

## 2022-11-06 VITALS — BP 110/85 | HR 68 | Ht 64.0 in | Wt 180.6 lb

## 2022-11-06 DIAGNOSIS — E559 Vitamin D deficiency, unspecified: Secondary | ICD-10-CM | POA: Diagnosis not present

## 2022-11-06 DIAGNOSIS — E538 Deficiency of other specified B group vitamins: Secondary | ICD-10-CM

## 2022-11-06 DIAGNOSIS — E039 Hypothyroidism, unspecified: Secondary | ICD-10-CM

## 2022-11-06 DIAGNOSIS — R7303 Prediabetes: Secondary | ICD-10-CM

## 2022-11-06 DIAGNOSIS — R109 Unspecified abdominal pain: Secondary | ICD-10-CM | POA: Insufficient documentation

## 2022-11-06 DIAGNOSIS — I10 Essential (primary) hypertension: Secondary | ICD-10-CM

## 2022-11-06 DIAGNOSIS — E782 Mixed hyperlipidemia: Secondary | ICD-10-CM

## 2022-11-06 DIAGNOSIS — R10A0 Flank pain, unspecified side: Secondary | ICD-10-CM

## 2022-11-06 DIAGNOSIS — Z6831 Body mass index (BMI) 31.0-31.9, adult: Secondary | ICD-10-CM

## 2022-11-06 LAB — POCT URINALYSIS DIPSTICK
Bilirubin, UA: NEGATIVE
Blood, UA: POSITIVE
Glucose, UA: NEGATIVE
Ketones, UA: NEGATIVE
Leukocytes, UA: NEGATIVE
Nitrite, UA: NEGATIVE
Protein, UA: NEGATIVE
Spec Grav, UA: >=1.03 — AB (ref 1.010–1.025)
Urobilinogen, UA: 0.2 E.U./dL
pH, UA: 6 (ref 5.0–8.0)

## 2022-11-06 LAB — POC CREATINE & ALBUMIN,URINE
Albumin/Creatinine Ratio, Urine, POC: <30
Creatinine, POC: 300 mg/dL
Microalbumin Ur, POC: 30 mg/L

## 2022-11-06 MED ORDER — BUPROPION HCL ER (XL) 300 MG PO TB24: 300.0000 mg | ORAL_TABLET | ORAL | 2 refills | Status: DC

## 2022-11-07 LAB — URINALYSIS, ROUTINE W REFLEX MICROSCOPIC: Protein,UA: NEGATIVE

## 2022-11-11 ENCOUNTER — Other Ambulatory Visit: Payer: Self-pay | Admitting: Family

## 2022-11-24 NOTE — Assessment & Plan Note (Signed)
Patient stable.  Well controlled with current therapy.   Continue current meds.  

## 2022-11-24 NOTE — Assessment & Plan Note (Signed)
Checking labs today.  Will continue supplements as needed.  

## 2022-11-24 NOTE — Assessment & Plan Note (Signed)
Checking labs today.  Continue current therapy for lipid control. Will modify as needed based on labwork results.  

## 2022-11-24 NOTE — Progress Notes (Signed)
Established Patient Office Visit  Subjective:  Patient ID: Valerie Archer, female    DOB: April 21, 1965  Age: 57 y.o. MRN: [id number]  Chief Complaint  Patient presents with   Acute Visit    Back pain, tingling   Patient is here with complaints of numbness and tingling in hands and feet and low back pain.  She has been having these issues for a few weeks, asks if there is anything we can do to help alleviate the symptoms.   She is due for labs Also needs microalbumin.   No other concerns today.   Past Medical History:  Diagnosis Date   ADHD (attention deficit hyperactivity disorder)    Anxiety    Depression    History of kidney stones    Hypothyroidism    Pre-diabetes    Thyroid disease     Past Surgical History:  Procedure Laterality Date   ABDOMINAL HYSTERECTOMY     CESAREAN SECTION     COLONOSCOPY W/ POLYPECTOMY     KNEE ARTHROSCOPY Right 08/21/2021   Procedure: ARTHROSCOPY KNEE, medial menisectomy;  Surgeon: Donato Heinz, MD;  Location: [facility] ORS;  Service: Orthopedics;  Laterality: Right;    Social History   Socioeconomic History   Marital status: Married    Spouse name: Not on file   Number of children: Not on file   Years of education: Not on file   Highest education level: Not on file  Occupational History   Not on file  Tobacco Use   Smoking status: Never   Smokeless tobacco: Never  Substance and Sexual Activity   Alcohol use: Yes    Comment: occasionally   Drug use: Never   Sexual activity: Not on file  Other Topics Concern   Not on file  Social History Narrative   Lives alone   Social Determinants of Health   Financial Resource Strain: Not on file  Food Insecurity: Not on file  Transportation Needs: Not on file  Physical Activity: Not on file  Stress: Not on file  Social Connections: Not on file  Intimate Partner Violence: Not on file    Family History  Problem Relation Age of Onset   Breast cancer Cousin        mat cousin    Bladder Cancer Mother    Bladder Cancer Maternal Uncle     No Known Allergies  Review of Systems  Musculoskeletal:  Positive for back pain.  Neurological:  Positive for tingling and sensory change (numbness).  All other systems reviewed and are negative.      Objective:   BP 110/85   Pulse 68   Ht 5\' 4"  (1.626 m)   Wt 180 lb 9.6 oz (81.9 kg)   SpO2 98%   BMI 31.00 kg/m   Vitals:   11/06/22 1113  BP: 110/85  Pulse: 68  Height: 5\' 4"  (1.626 m)  Weight: 180 lb 9.6 oz (81.9 kg)  SpO2: 98%  BMI (Calculated): 30.98    Physical Exam Vitals and nursing note reviewed.  Constitutional:      Appearance: Normal appearance. She is obese.  HENT:     Head: Normocephalic.  Eyes:     Extraocular Movements: Extraocular movements intact.     Conjunctiva/sclera: Conjunctivae normal.     Pupils: Pupils are equal, round, and reactive to light.  Cardiovascular:     Rate and Rhythm: Normal rate.     Pulses: Normal pulses.  Pulmonary:     Effort: Pulmonary effort  is normal.  Neurological:     General: No focal deficit present.     Mental Status: She is alert and oriented to person, place, and time. Mental status is at baseline.  Psychiatric:        Mood and Affect: Mood normal.        Behavior: Behavior normal.        Thought Content: Thought content normal.        Judgment: Judgment normal.    Results for orders placed or performed in visit on 11/06/22  Urine Culture   Specimen: Urine, Clean Catch   UC  Result Value Ref Range   Urine Culture, Routine Final report    Organism ID, Bacteria Comment   Microscopic Examination  Result Value Ref Range   WBC, UA 0-5 0 - 5 /hpf   RBC, Urine >30 (A) 0 - 2 /hpf   Epithelial Cells (non renal) 0-10 0 - 10 /hpf   Casts None seen None seen /lpf   Bacteria, UA None seen None seen/Few  Lipid panel  Result Value Ref Range   Cholesterol, Total 162 100 - 199 mg/dL   Triglycerides 086 (H) 0 - 149 mg/dL   HDL 53 >57 mg/dL   VLDL  Cholesterol Cal 26 5 - 40 mg/dL   LDL Chol Calc (NIH) 83 0 - 99 mg/dL   Chol/HDL Ratio 3.1 0.0 - 4.4 ratio  VITAMIN D 25 Hydroxy (Vit-D Deficiency, Fractures)  Result Value Ref Range   Vit D, 25-Hydroxy 28.8 (L) 30.0 - 100.0 ng/mL  CBC With Differential  Result Value Ref Range   WBC 4.3 3.4 - 10.8 x10E3/uL   RBC 4.14 3.77 - 5.28 x10E6/uL   Hemoglobin 12.0 11.1 - 15.9 g/dL   Hematocrit 84.6 96.2 - 46.6 %   MCV 89 79 - 97 fL   MCH 29.0 26.6 - 33.0 pg   MCHC 32.7 31.5 - 35.7 g/dL   RDW 95.2 84.1 - 32.4 %   Neutrophils 51 Not Estab. %   Lymphs 41 Not Estab. %   Monocytes 6 Not Estab. %   Eos 1 Not Estab. %   Basos 1 Not Estab. %   Neutrophils Absolute 2.2 1.4 - 7.0 x10E3/uL   Lymphocytes Absolute 1.8 0.7 - 3.1 x10E3/uL   Monocytes Absolute 0.3 0.1 - 0.9 x10E3/uL   EOS (ABSOLUTE) 0.1 0.0 - 0.4 x10E3/uL   Basophils Absolute 0.1 0.0 - 0.2 x10E3/uL   Immature Granulocytes 0 Not Estab. %   Immature Grans (Abs) 0.0 0.0 - 0.1 x10E3/uL  CMP14+EGFR  Result Value Ref Range   Glucose 96 70 - 99 mg/dL   BUN 14 6 - 24 mg/dL   Creatinine, Ser 4.01 0.57 - 1.00 mg/dL   eGFR 73 >02 VO/ZDG/6.44   BUN/Creatinine Ratio 15 9 - 23   Sodium 142 134 - 144 mmol/L   Potassium 4.4 3.5 - 5.2 mmol/L   Chloride 104 96 - 106 mmol/L   CO2 25 20 - 29 mmol/L   Calcium 9.3 8.7 - 10.2 mg/dL   Total Protein 6.8 6.0 - 8.5 g/dL   Albumin 4.3 3.8 - 4.9 g/dL   Globulin, Total 2.5 1.5 - 4.5 g/dL   Bilirubin Total 0.6 0.0 - 1.2 mg/dL   Alkaline Phosphatase 110 44 - 121 IU/L   AST 20 0 - 40 IU/L   ALT 14 0 - 32 IU/L  TSH  Result Value Ref Range   TSH 1.180 0.450 - 4.500 uIU/mL  Hemoglobin  A1c  Result Value Ref Range   Hgb A1c MFr Bld 5.9 (H) 4.8 - 5.6 %   Est. average glucose Bld gHb Est-mCnc 123 mg/dL  Vitamin Z30  Result Value Ref Range   Vitamin B-12 234 232 - 1,245 pg/mL  Urinalysis, Routine w reflex microscopic  Result Value Ref Range   Specific Gravity, UA 1.025 1.005 - 1.030   pH, UA 5.5 5.0 - 7.5    Color, UA Yellow Yellow   Appearance Ur Cloudy (A) Clear   Leukocytes,UA Negative Negative   Protein,UA Negative Negative/Trace   Glucose, UA Negative Negative   Ketones, UA Negative Negative   RBC, UA 2+ (A) Negative   Bilirubin, UA Negative Negative   Urobilinogen, Ur 0.2 0.2 - 1.0 mg/dL   Nitrite, UA Negative Negative   Microscopic Examination See below:   POCT Urinalysis Dipstick  Result Value Ref Range   Color, UA yellow    Clarity, UA clear    Glucose, UA Negative Negative   Bilirubin, UA neg    Ketones, UA neg    Spec Grav, UA >=1.030 (A) 1.010 - 1.025   Blood, UA pos    pH, UA 6.0 5.0 - 8.0   Protein, UA Negative Negative   Urobilinogen, UA 0.2 0.2 or 1.0 E.U./dL   Nitrite, UA neg    Leukocytes, UA Negative Negative   Appearance clear    Odor none   POC CREATINE & ALBUMIN,URINE  Result Value Ref Range   Microalbumin Ur, POC 30 mg/L   Creatinine, POC 300 mg/dL   Albumin/Creatinine Ratio, Urine, POC <30     Recent Results (from the past 2160 hour(s))  POCT Urinalysis Dipstick     Status: Abnormal   Collection Time: 11/06/22 11:48 AM  Result Value Ref Range   Color, UA yellow    Clarity, UA clear    Glucose, UA Negative Negative   Bilirubin, UA neg    Ketones, UA neg    Spec Grav, UA >=1.030 (A) 1.010 - 1.025   Blood, UA pos    pH, UA 6.0 5.0 - 8.0   Protein, UA Negative Negative   Urobilinogen, UA 0.2 0.2 or 1.0 E.U./dL   Nitrite, UA neg    Leukocytes, UA Negative Negative   Appearance clear    Odor none   POC CREATINE & ALBUMIN,URINE     Status: None   Collection Time: 11/06/22 11:48 AM  Result Value Ref Range   Microalbumin Ur, POC 30 mg/L   Creatinine, POC 300 mg/dL   Albumin/Creatinine Ratio, Urine, POC <30   Lipid panel     Status: Abnormal   Collection Time: 11/06/22 11:51 AM  Result Value Ref Range   Cholesterol, Total 162 100 - 199 mg/dL   Triglycerides 865 (H) 0 - 149 mg/dL   HDL 53 >78 mg/dL   VLDL Cholesterol Cal 26 5 - 40 mg/dL    LDL Chol Calc (NIH) 83 0 - 99 mg/dL   Chol/HDL Ratio 3.1 0.0 - 4.4 ratio    Comment:                                   T. Chol/HDL Ratio  Men  Women                               1/2 Avg.Risk  3.4    3.3                                   Avg.Risk  5.0    4.4                                2X Avg.Risk  9.6    7.1                                3X Avg.Risk 23.4   11.0   VITAMIN D 25 Hydroxy (Vit-D Deficiency, Fractures)     Status: Abnormal   Collection Time: 11/06/22 11:51 AM  Result Value Ref Range   Vit D, 25-Hydroxy 28.8 (L) 30.0 - 100.0 ng/mL    Comment: Vitamin D deficiency has been defined by the Institute of Medicine and an Endocrine Society practice guideline as a level of serum 25-OH vitamin D less than 20 ng/mL (1,2). The Endocrine Society went on to further define vitamin D insufficiency as a level between 21 and 29 ng/mL (2). 1. IOM (Institute of Medicine). 2010. Dietary reference    intakes for calcium and D. [place]: The    [employer]. 2. Holick MF, Binkley [place], Bischoff-Ferrari HA, et al.    Evaluation, treatment, and prevention of vitamin D    deficiency: an Endocrine Society clinical practice    guideline. JCEM. 2011 Jul; 96(7):1911-30.   CBC With Differential     Status: None   Collection Time: 11/06/22 11:51 AM  Result Value Ref Range   WBC 4.3 3.4 - 10.8 x10E3/uL   RBC 4.14 3.77 - 5.28 x10E6/uL   Hemoglobin 12.0 11.1 - 15.9 g/dL   Hematocrit 25.9 56.3 - 46.6 %   MCV 89 79 - 97 fL   MCH 29.0 26.6 - 33.0 pg   MCHC 32.7 31.5 - 35.7 g/dL   RDW 87.5 64.3 - 32.9 %   Neutrophils 51 Not Estab. %   Lymphs 41 Not Estab. %   Monocytes 6 Not Estab. %   Eos 1 Not Estab. %   Basos 1 Not Estab. %   Neutrophils Absolute 2.2 1.4 - 7.0 x10E3/uL   Lymphocytes Absolute 1.8 0.7 - 3.1 x10E3/uL   Monocytes Absolute 0.3 0.1 - 0.9 x10E3/uL   EOS (ABSOLUTE) 0.1 0.0 - 0.4 x10E3/uL   Basophils Absolute 0.1 0.0 - 0.2  x10E3/uL   Immature Granulocytes 0 Not Estab. %   Immature Grans (Abs) 0.0 0.0 - 0.1 x10E3/uL    Comment: **Effective November 10, 2022, profile [id number] CBC/Differential**   (No Platelet) will be made non-orderable. [facility] Offers:   [id number] CBC With Differential/Platelet   CMP14+EGFR     Status: None   Collection Time: 11/06/22 11:51 AM  Result Value Ref Range   Glucose 96 70 - 99 mg/dL   BUN 14 6 - 24 mg/dL   Creatinine, Ser 6.60 0.57 - 1.00 mg/dL   eGFR 73 >63 KZ/SWF/0.93   BUN/Creatinine Ratio 15 9 - 23   Sodium 142 134 - 144 mmol/L   Potassium 4.4 3.5 - 5.2 mmol/L  Chloride 104 96 - 106 mmol/L   CO2 25 20 - 29 mmol/L   Calcium 9.3 8.7 - 10.2 mg/dL   Total Protein 6.8 6.0 - 8.5 g/dL   Albumin 4.3 3.8 - 4.9 g/dL   Globulin, Total 2.5 1.5 - 4.5 g/dL   Bilirubin Total 0.6 0.0 - 1.2 mg/dL   Alkaline Phosphatase 110 44 - 121 IU/L   AST 20 0 - 40 IU/L   ALT 14 0 - 32 IU/L  TSH     Status: None   Collection Time: 11/06/22 11:51 AM  Result Value Ref Range   TSH 1.180 0.450 - 4.500 uIU/mL  Hemoglobin A1c     Status: Abnormal   Collection Time: 11/06/22 11:51 AM  Result Value Ref Range   Hgb A1c MFr Bld 5.9 (H) 4.8 - 5.6 %    Comment:          Prediabetes: 5.7 - 6.4          Diabetes: >6.4          Glycemic control for adults with diabetes: <7.0    Est. average glucose Bld gHb Est-mCnc 123 mg/dL  Vitamin X91     Status: None   Collection Time: 11/06/22 11:51 AM  Result Value Ref Range   Vitamin B-12 234 232 - 1,245 pg/mL  Urinalysis, Routine w reflex microscopic     Status: Abnormal   Collection Time: 11/06/22  4:27 PM  Result Value Ref Range   Specific Gravity, UA 1.025 1.005 - 1.030   pH, UA 5.5 5.0 - 7.5   Color, UA Yellow Yellow   Appearance Ur Cloudy (A) Clear   Leukocytes,UA Negative Negative   Protein,UA Negative Negative/Trace   Glucose, UA Negative Negative   Ketones, UA Negative Negative   RBC, UA 2+ (A) Negative   Bilirubin, UA Negative Negative    Urobilinogen, Ur 0.2 0.2 - 1.0 mg/dL   Nitrite, UA Negative Negative   Microscopic Examination See below:     Comment: Microscopic was indicated and was performed.  Microscopic Examination     Status: Abnormal   Collection Time: 11/06/22  4:27 PM  Result Value Ref Range   WBC, UA 0-5 0 - 5 /hpf   RBC, Urine >30 (A) 0 - 2 /hpf   Epithelial Cells (non renal) 0-10 0 - 10 /hpf   Casts None seen None seen /lpf   Bacteria, UA None seen None seen/Few  Urine Culture     Status: None   Collection Time: 11/06/22  4:30 PM   Specimen: Urine, Clean Catch   UC  Result Value Ref Range   Urine Culture, Routine Final report    Organism ID, Bacteria Comment     Comment: Mixed urogenital flora Less than 10,000 colonies/mL        Assessment & Plan:   Problem List Items Addressed This Visit       Active Problems   Mixed hyperlipidemia (Chronic)    Checking labs today.  Continue current therapy for lipid control. Will modify as needed based on labwork results.        Relevant Orders   Lipid panel (Completed)   CBC With Differential (Completed)   CMP14+EGFR (Completed)   Vitamin D deficiency, unspecified - Primary    Checking labs today.  Will continue supplements as needed.       Relevant Orders   VITAMIN D 25 Hydroxy (Vit-D Deficiency, Fractures) (Completed)   CBC With Differential (Completed)   CMP14+EGFR (Completed)  Prediabetes (Chronic)    A1C Continues to be in prediabetic ranges.  Will reassess at follow up after next lab check.  Patient counseled on dietary choices and verbalized understanding.  Patient educated on foods that contain carbohydrates and the need to decrease intake.  We discussed prediabetes, and what it means and the need for strict dietary control to prevent progression to type 2 diabetes.  Advised to decrease intake of sugary drinks, including sodas, sweet tea, and some juices, and of starch and sugar heavy foods (ie., potatoes, rice, bread, pasta,  desserts). She verbalizes understanding and agreement with the changes discussed today.        Relevant Orders   CBC With Differential (Completed)   CMP14+EGFR (Completed)   Hemoglobin A1c (Completed)   Essential hypertension, benign (Chronic)    Blood pressure well controlled with current medications.  Continue current therapy.  Will reassess at follow up.       Relevant Orders   CBC With Differential (Completed)   CMP14+EGFR (Completed)   POC CREATINE & ALBUMIN,URINE (Completed)   Hypothyroidism (acquired) (Chronic)    Patient stable.  Well controlled with current therapy.   Continue current meds.        Relevant Orders   CBC With Differential (Completed)   CMP14+EGFR (Completed)   TSH (Completed)   BMI 31.0-31.9,adult    Continue current meds.  Will adjust as needed based on results.  The patient is asked to make an attempt to improve diet and exercise patterns to aid in medical management of this problem. Addressed importance of increasing and maintaining water intake.       Relevant Orders   CBC With Differential (Completed)   CMP14+EGFR (Completed)   Flank pain   Relevant Orders   POCT Urinalysis Dipstick (Completed)   POC CREATINE & ALBUMIN,URINE (Completed)   Urinalysis, Routine w reflex microscopic (Completed)   Urine Culture (Completed)   Microscopic Examination (Completed)   Other Visit Diagnoses     B12 deficiency due to diet       Relevant Orders   CBC With Differential (Completed)   CMP14+EGFR (Completed)   Vitamin B12 (Completed)      Getting Labs today. Also checking dipstick as this could possibly be UTI or kidney stones.  Will call with results and discuss at follow up in 1 month.   Return in about 1 month (around 12/07/2022) for F/U.   Total time spent: 20 minutes  Miki Kins, FNP  11/06/2022   This document may have been prepared by [facility] Voice Recognition software and as such may include unintentional dictation errors.

## 2022-11-24 NOTE — Assessment & Plan Note (Signed)
Blood pressure well controlled with current medications.  Continue current therapy.  Will reassess at follow up.  

## 2022-11-24 NOTE — Assessment & Plan Note (Signed)

## 2022-11-24 NOTE — Assessment & Plan Note (Signed)
Continue current meds.  Will adjust as needed based on results.  The patient is asked to make an attempt to improve diet and exercise patterns to aid in medical management of this problem. Addressed importance of increasing and maintaining water intake.   

## 2022-12-03 ENCOUNTER — Encounter: Payer: Self-pay | Admitting: Family

## 2022-12-03 ENCOUNTER — Ambulatory Visit: Payer: BC Managed Care – PPO | Admitting: Family

## 2022-12-03 VITALS — BP 113/62 | HR 70 | Ht 64.0 in | Wt 180.4 lb

## 2022-12-03 DIAGNOSIS — M159 Polyosteoarthritis, unspecified: Secondary | ICD-10-CM

## 2022-12-03 DIAGNOSIS — K5904 Chronic idiopathic constipation: Secondary | ICD-10-CM

## 2022-12-03 DIAGNOSIS — E559 Vitamin D deficiency, unspecified: Secondary | ICD-10-CM

## 2022-12-03 DIAGNOSIS — E538 Deficiency of other specified B group vitamins: Secondary | ICD-10-CM | POA: Diagnosis not present

## 2022-12-03 DIAGNOSIS — I10 Essential (primary) hypertension: Secondary | ICD-10-CM

## 2022-12-03 DIAGNOSIS — F900 Attention-deficit hyperactivity disorder, predominantly inattentive type: Secondary | ICD-10-CM | POA: Insufficient documentation

## 2022-12-03 MED ORDER — LINACLOTIDE 72 MCG PO CAPS: 72.0000 ug | ORAL_CAPSULE | Freq: Every day | ORAL | Status: DC

## 2022-12-03 MED ORDER — AMPHETAMINE-DEXTROAMPHETAMINE 15 MG PO TABS: 15.0000 mg | ORAL_TABLET | Freq: Every day | ORAL | 0 refills | Status: DC

## 2022-12-03 MED ORDER — CYANOCOBALAMIN 1000 MCG/ML IJ SOLN
1000.0000 ug | Freq: Once | INTRAMUSCULAR | Status: AC
Start: 2022-12-03 — End: 2022-12-03
  Administered 2022-12-03: 1000 ug via INTRAMUSCULAR

## 2022-12-03 MED ORDER — VITAMIN D (ERGOCALCIFEROL) 1.25 MG (50000 UNIT) PO CAPS: 50000.0000 [IU] | ORAL_CAPSULE | ORAL | 1 refills | Status: DC

## 2022-12-03 NOTE — Assessment & Plan Note (Signed)
Patient stable.  Well controlled with current therapy.   Continue current meds.  Sending refills.

## 2022-12-03 NOTE — Progress Notes (Signed)
Established Patient Office Visit  Subjective:  Patient ID: Valerie Archer, female    DOB: Sep 30, 1965  Age: 57 y.o. MRN: [id number]  Chief Complaint  Patient presents with   Follow-up    1 month follow up    Patient here for her 1 month follow up.  She is doing well overall, but does have a few questions: 1) she asks about a refill for her adderall.  2) Needs a B12 based on her lab resutls.  3) Asks about Vitamin D supplement 4) Asks if she can take Glucosamine to help with her joints.  5) Has been having some trouble with constipation.  This is a chronic problem for her, but it has been worse lately, and has been causing her to have hemorrhoids which are irritating her and causing itching and discomfort.  No other concerns at this time.   Past Medical History:  Diagnosis Date   ADHD (attention deficit hyperactivity disorder)    Anxiety    Depression    History of kidney stones    Hypothyroidism    Pre-diabetes    Thyroid disease     Past Surgical History:  Procedure Laterality Date   ABDOMINAL HYSTERECTOMY     CESAREAN SECTION     COLONOSCOPY W/ POLYPECTOMY     KNEE ARTHROSCOPY Right 08/21/2021   Procedure: ARTHROSCOPY KNEE, medial menisectomy;  Surgeon: Donato Heinz, MD;  Location: [facility] ORS;  Service: Orthopedics;  Laterality: Right;    Social History   Socioeconomic History   Marital status: Married    Spouse name: Not on file   Number of children: Not on file   Years of education: Not on file   Highest education level: Not on file  Occupational History   Not on file  Tobacco Use   Smoking status: Never   Smokeless tobacco: Never  Substance and Sexual Activity   Alcohol use: Yes    Comment: occasionally   Drug use: Never   Sexual activity: Not on file  Other Topics Concern   Not on file  Social History Narrative   Lives alone   Social Determinants of Health   Financial Resource Strain: Not on file  Food Insecurity: Not on file  Transportation  Needs: Not on file  Physical Activity: Not on file  Stress: Not on file  Social Connections: Not on file  Intimate Partner Violence: Not on file    Family History  Problem Relation Age of Onset   Breast cancer Cousin        mat cousin   Bladder Cancer Mother    Bladder Cancer Maternal Uncle     No Known Allergies  Review of Systems  Constitutional:  Positive for malaise/fatigue.  Gastrointestinal:  Positive for constipation.  All other systems reviewed and are negative.      Objective:   BP 113/62   Pulse 70   Ht 5\' 4"  (1.626 m)   Wt 180 lb 6.4 oz (81.8 kg)   SpO2 96%   BMI 30.97 kg/m   Vitals:   12/03/22 1000  BP: 113/62  Pulse: 70  Height: 5\' 4"  (1.626 m)  Weight: 180 lb 6.4 oz (81.8 kg)  SpO2: 96%  BMI (Calculated): 30.95    Physical Exam Vitals and nursing note reviewed.  Constitutional:      General: She is awake.     Appearance: Normal appearance. She is well-developed, well-groomed and overweight.  HENT:     Head: Normocephalic.  Eyes:  Extraocular Movements: Extraocular movements intact.     Conjunctiva/sclera: Conjunctivae normal.     Pupils: Pupils are equal, round, and reactive to light.  Cardiovascular:     Rate and Rhythm: Normal rate.  Pulmonary:     Effort: Pulmonary effort is normal.  Neurological:     Mental Status: She is alert.  Psychiatric:        Behavior: Behavior is cooperative.      No results found for any visits on 12/03/22.  Recent Results (from the past 2160 hour(s))  POCT Urinalysis Dipstick     Status: Abnormal   Collection Time: 11/06/22 11:48 AM  Result Value Ref Range   Color, UA yellow    Clarity, UA clear    Glucose, UA Negative Negative   Bilirubin, UA neg    Ketones, UA neg    Spec Grav, UA >=1.030 (A) 1.010 - 1.025   Blood, UA pos    pH, UA 6.0 5.0 - 8.0   Protein, UA Negative Negative   Urobilinogen, UA 0.2 0.2 or 1.0 E.U./dL   Nitrite, UA neg    Leukocytes, UA Negative Negative    Appearance clear    Odor none   POC CREATINE & ALBUMIN,URINE     Status: None   Collection Time: 11/06/22 11:48 AM  Result Value Ref Range   Microalbumin Ur, POC 30 mg/L   Creatinine, POC 300 mg/dL   Albumin/Creatinine Ratio, Urine, POC <30   Lipid panel     Status: Abnormal   Collection Time: 11/06/22 11:51 AM  Result Value Ref Range   Cholesterol, Total 162 100 - 199 mg/dL   Triglycerides 606 (H) 0 - 149 mg/dL   HDL 53 >30 mg/dL   VLDL Cholesterol Cal 26 5 - 40 mg/dL   LDL Chol Calc (NIH) 83 0 - 99 mg/dL   Chol/HDL Ratio 3.1 0.0 - 4.4 ratio    Comment:                                   T. Chol/HDL Ratio                                             Men  Women                               1/2 Avg.Risk  3.4    3.3                                   Avg.Risk  5.0    4.4                                2X Avg.Risk  9.6    7.1                                3X Avg.Risk 23.4   11.0   VITAMIN D 25 Hydroxy (Vit-D Deficiency, Fractures)     Status: Abnormal   Collection Time: 11/06/22 11:51 AM  Result Value Ref Range   Vit D, 25-Hydroxy  28.8 (L) 30.0 - 100.0 ng/mL    Comment: Vitamin D deficiency has been defined by the Institute of Medicine and an Endocrine Society practice guideline as a level of serum 25-OH vitamin D less than 20 ng/mL (1,2). The Endocrine Society went on to further define vitamin D insufficiency as a level between 21 and 29 ng/mL (2). 1. IOM (Institute of Medicine). 2010. Dietary reference    intakes for calcium and D. [place]: The    [employer]. 2. Holick MF, Binkley [place], Bischoff-Ferrari HA, et al.    Evaluation, treatment, and prevention of vitamin D    deficiency: an Endocrine Society clinical practice    guideline. JCEM. 2011 Jul; 96(7):1911-30.   CBC With Differential     Status: None   Collection Time: 11/06/22 11:51 AM  Result Value Ref Range   WBC 4.3 3.4 - 10.8 x10E3/uL   RBC 4.14 3.77 - 5.28 x10E6/uL   Hemoglobin 12.0 11.1 - 15.9  g/dL   Hematocrit 65.7 84.6 - 46.6 %   MCV 89 79 - 97 fL   MCH 29.0 26.6 - 33.0 pg   MCHC 32.7 31.5 - 35.7 g/dL   RDW 96.2 95.2 - 84.1 %   Neutrophils 51 Not Estab. %   Lymphs 41 Not Estab. %   Monocytes 6 Not Estab. %   Eos 1 Not Estab. %   Basos 1 Not Estab. %   Neutrophils Absolute 2.2 1.4 - 7.0 x10E3/uL   Lymphocytes Absolute 1.8 0.7 - 3.1 x10E3/uL   Monocytes Absolute 0.3 0.1 - 0.9 x10E3/uL   EOS (ABSOLUTE) 0.1 0.0 - 0.4 x10E3/uL   Basophils Absolute 0.1 0.0 - 0.2 x10E3/uL   Immature Granulocytes 0 Not Estab. %   Immature Grans (Abs) 0.0 0.0 - 0.1 x10E3/uL    Comment: **Effective November 10, 2022, profile [id number] CBC/Differential**   (No Platelet) will be made non-orderable. [facility] Offers:   [id number] CBC With Differential/Platelet   CMP14+EGFR     Status: None   Collection Time: 11/06/22 11:51 AM  Result Value Ref Range   Glucose 96 70 - 99 mg/dL   BUN 14 6 - 24 mg/dL   Creatinine, Ser 0.27 0.57 - 1.00 mg/dL   eGFR 73 >25 DG/UYQ/0.34   BUN/Creatinine Ratio 15 9 - 23   Sodium 142 134 - 144 mmol/L   Potassium 4.4 3.5 - 5.2 mmol/L   Chloride 104 96 - 106 mmol/L   CO2 25 20 - 29 mmol/L   Calcium 9.3 8.7 - 10.2 mg/dL   Total Protein 6.8 6.0 - 8.5 g/dL   Albumin 4.3 3.8 - 4.9 g/dL   Globulin, Total 2.5 1.5 - 4.5 g/dL   Bilirubin Total 0.6 0.0 - 1.2 mg/dL   Alkaline Phosphatase 110 44 - 121 IU/L   AST 20 0 - 40 IU/L   ALT 14 0 - 32 IU/L  TSH     Status: None   Collection Time: 11/06/22 11:51 AM  Result Value Ref Range   TSH 1.180 0.450 - 4.500 uIU/mL  Hemoglobin A1c     Status: Abnormal   Collection Time: 11/06/22 11:51 AM  Result Value Ref Range   Hgb A1c MFr Bld 5.9 (H) 4.8 - 5.6 %    Comment:          Prediabetes: 5.7 - 6.4          Diabetes: >6.4          Glycemic control for adults with diabetes: <7.0  Est. average glucose Bld gHb Est-mCnc 123 mg/dL  Vitamin Q65     Status: None   Collection Time: 11/06/22 11:51 AM  Result Value Ref Range   Vitamin B-12 234  232 - 1,245 pg/mL  Urinalysis, Routine w reflex microscopic     Status: Abnormal   Collection Time: 11/06/22  4:27 PM  Result Value Ref Range   Specific Gravity, UA 1.025 1.005 - 1.030   pH, UA 5.5 5.0 - 7.5   Color, UA Yellow Yellow   Appearance Ur Cloudy (A) Clear   Leukocytes,UA Negative Negative   Protein,UA Negative Negative/Trace   Glucose, UA Negative Negative   Ketones, UA Negative Negative   RBC, UA 2+ (A) Negative   Bilirubin, UA Negative Negative   Urobilinogen, Ur 0.2 0.2 - 1.0 mg/dL   Nitrite, UA Negative Negative   Microscopic Examination See below:     Comment: Microscopic was indicated and was performed.  Microscopic Examination     Status: Abnormal   Collection Time: 11/06/22  4:27 PM  Result Value Ref Range   WBC, UA 0-5 0 - 5 /hpf   RBC, Urine >30 (A) 0 - 2 /hpf   Epithelial Cells (non renal) 0-10 0 - 10 /hpf   Casts None seen None seen /lpf   Bacteria, UA None seen None seen/Few  Urine Culture     Status: None   Collection Time: 11/06/22  4:30 PM   Specimen: Urine, Clean Catch   UC  Result Value Ref Range   Urine Culture, Routine Final report    Organism ID, Bacteria Comment     Comment: Mixed urogenital flora Less than 10,000 colonies/mL        Assessment & Plan:   Problem List Items Addressed This Visit       Active Problems   Vitamin D deficiency, unspecified    Vitamin D was <30 Patient counseled that this could be affecting her energy level and bone health.   Sending supplementation for pt.  Will recheck at follow up.         Essential hypertension, benign (Chronic)    Blood pressure well controlled with current medications.  Continue current therapy.  Will reassess at follow up.       B12 deficiency due to diet - Primary    B12 injection given in office today.  Will repeat in 1 month.       Attention deficit hyperactivity disorder (ADHD), predominantly inattentive type    Patient stable.  Well controlled with current  therapy.   Continue current meds.  Sending refills.       Primary osteoarthritis involving multiple joints   Chronic idiopathic constipation    Samples of Linzess given in office today.  Patient will call with results and let me know if this works.   Recheck at follow up.         Return in about 3 months (around 03/05/2023) for F/U.   Total time spent: 30 minutes  Miki Kins, FNP  12/03/2022   This document may have been prepared by [facility] Voice Recognition software and as such may include unintentional dictation errors.

## 2022-12-03 NOTE — Assessment & Plan Note (Signed)
B12 injection given in office today.  Will repeat in 1 month.

## 2022-12-03 NOTE — Assessment & Plan Note (Addendum)
Vitamin D was <30 Patient counseled that this could be affecting her energy level and bone health.   Sending supplementation for pt.  Will recheck at follow up.

## 2022-12-03 NOTE — Assessment & Plan Note (Signed)
Samples of Linzess given in office today.  Patient will call with results and let me know if this works.   Recheck at follow up.

## 2022-12-03 NOTE — Assessment & Plan Note (Signed)
Blood pressure well controlled with current medications.  Continue current therapy.  Will reassess at follow up.  

## 2022-12-17 ENCOUNTER — Ambulatory Visit (INDEPENDENT_AMBULATORY_CARE_PROVIDER_SITE_OTHER): Payer: BC Managed Care – PPO | Admitting: Family

## 2022-12-17 DIAGNOSIS — E538 Deficiency of other specified B group vitamins: Secondary | ICD-10-CM | POA: Diagnosis not present

## 2022-12-17 MED ORDER — CYANOCOBALAMIN 1000 MCG/ML IJ SOLN
1000.0000 ug | Freq: Once | INTRAMUSCULAR | Status: DC
Start: 2022-12-17 — End: 2022-12-31

## 2022-12-17 MED ORDER — CYANOCOBALAMIN 1000 MCG/ML IJ SOLN
1000.0000 ug | Freq: Once | INTRAMUSCULAR | Status: AC
Start: 2022-12-17 — End: 2022-12-17
  Administered 2022-12-17: 1000 ug via INTRAMUSCULAR

## 2022-12-20 ENCOUNTER — Encounter: Payer: Self-pay | Admitting: Family

## 2022-12-20 NOTE — Progress Notes (Signed)
   CHIEF COMPLAINT  B12 Shot     REASON FOR VISIT  B12 Injection     ASSESSMENT  B12 Deficiency, Unspecified     PLAN  Diagnoses and all orders for this visit:  B12 deficiency due to diet -     cyanocobalamin (VITAMIN B12) injection 1,000 mcg -     cyanocobalamin (VITAMIN B12) injection 1,000 mcg     Pt. given B12 injection in clinic.  Return for next injection per provider instructions.   Total time spent: 5 minutes  Miki Kins, FNP  12/17/2022

## 2022-12-31 ENCOUNTER — Ambulatory Visit (INDEPENDENT_AMBULATORY_CARE_PROVIDER_SITE_OTHER): Payer: BC Managed Care – PPO | Admitting: Family

## 2022-12-31 DIAGNOSIS — E538 Deficiency of other specified B group vitamins: Secondary | ICD-10-CM

## 2022-12-31 MED ORDER — CYANOCOBALAMIN 1000 MCG/ML IJ SOLN
1000.0000 ug | Freq: Once | INTRAMUSCULAR | Status: AC
Start: 2022-12-31 — End: 2022-12-31
  Administered 2022-12-31: 1000 ug via INTRAMUSCULAR

## 2023-01-01 ENCOUNTER — Encounter: Payer: Self-pay | Admitting: Family

## 2023-01-01 NOTE — Progress Notes (Signed)
   CHIEF COMPLAINT  B12 Shot     REASON FOR VISIT  B12 Injection     ASSESSMENT  B12 Deficiency, Unspecified     PLAN  Diagnoses and all orders for this visit:  B12 deficiency due to diet -     cyanocobalamin (VITAMIN B12) injection 1,000 mcg     Pt. given B12 injection in clinic.  Return for next injection per provider instructions.   Total time spent: 5 minutes  Miki Kins, FNP  12/31/2022

## 2023-01-14 ENCOUNTER — Ambulatory Visit (INDEPENDENT_AMBULATORY_CARE_PROVIDER_SITE_OTHER): Payer: BC Managed Care – PPO | Admitting: Family

## 2023-01-14 ENCOUNTER — Encounter: Payer: Self-pay | Admitting: Family

## 2023-01-14 DIAGNOSIS — E538 Deficiency of other specified B group vitamins: Secondary | ICD-10-CM | POA: Diagnosis not present

## 2023-01-14 MED ORDER — CYANOCOBALAMIN 1000 MCG/ML IJ SOLN
1000.0000 ug | Freq: Once | INTRAMUSCULAR | Status: AC
Start: 2023-01-14 — End: 2023-01-14
  Administered 2023-01-14: 1000 ug via INTRAMUSCULAR

## 2023-01-14 NOTE — Progress Notes (Signed)
   CHIEF COMPLAINT  B12 Shot     REASON FOR VISIT  B12 Injection     ASSESSMENT  B12 Deficiency, Unspecified     PLAN  Diagnoses and all orders for this visit:  B12 deficiency due to diet -     cyanocobalamin (VITAMIN B12) injection 1,000 mcg     Pt. given B12 injection in clinic.  Return for next injection per provider instructions.   Total time spent: 5 minutes  Miki Kins, FNP  01/14/2023

## 2023-01-29 ENCOUNTER — Other Ambulatory Visit: Payer: Self-pay | Admitting: Family

## 2023-03-11 ENCOUNTER — Ambulatory Visit (INDEPENDENT_AMBULATORY_CARE_PROVIDER_SITE_OTHER): Payer: BC Managed Care – PPO | Admitting: Family

## 2023-03-11 ENCOUNTER — Encounter: Payer: Self-pay | Admitting: Family

## 2023-03-11 VITALS — BP 120/82 | HR 106 | Ht 64.0 in | Wt 186.0 lb

## 2023-03-11 DIAGNOSIS — E782 Mixed hyperlipidemia: Secondary | ICD-10-CM

## 2023-03-11 DIAGNOSIS — E559 Vitamin D deficiency, unspecified: Secondary | ICD-10-CM

## 2023-03-11 DIAGNOSIS — E039 Hypothyroidism, unspecified: Secondary | ICD-10-CM

## 2023-03-11 DIAGNOSIS — I10 Essential (primary) hypertension: Secondary | ICD-10-CM | POA: Diagnosis not present

## 2023-03-11 DIAGNOSIS — R7303 Prediabetes: Secondary | ICD-10-CM | POA: Diagnosis not present

## 2023-03-11 DIAGNOSIS — E538 Deficiency of other specified B group vitamins: Secondary | ICD-10-CM

## 2023-03-11 MED ORDER — VITAMIN D (ERGOCALCIFEROL) 1.25 MG (50000 UNIT) PO CAPS: 50000.0000 [IU] | ORAL_CAPSULE | ORAL | 3 refills | Status: DC

## 2023-03-11 NOTE — Progress Notes (Signed)
Established Patient Office Visit  Subjective:  Patient ID: Valerie Archer, female    DOB: 11/23/1965  Age: 57 y.o. MRN: [id number]  Chief Complaint  Patient presents with   Follow-up    3 month    Patient is here today for her 3 months follow up.  She has been feeling fairly well since last appointment.   She does have additional concerns to discuss today.  Labs are due today, but patient is not fasting. Will come in next week for lab draw. She needs refills.   I have reviewed her active problem list, medication list, allergies, health maintenance, notes from last encounter, lab results for her appointment today.    No other concerns at this time.   Past Medical History:  Diagnosis Date   ADHD (attention deficit hyperactivity disorder)    Anxiety    Depression    History of kidney stones    Hypothyroidism    Pre-diabetes    Thyroid disease     Past Surgical History:  Procedure Laterality Date   ABDOMINAL HYSTERECTOMY     CESAREAN SECTION     COLONOSCOPY W/ POLYPECTOMY     KNEE ARTHROSCOPY Right 08/21/2021   Procedure: ARTHROSCOPY KNEE, medial menisectomy;  Surgeon: Donato Heinz, MD;  Location: [facility] ORS;  Service: Orthopedics;  Laterality: Right;    Social History   Socioeconomic History   Marital status: Married    Spouse name: Not on file   Number of children: Not on file   Years of education: Not on file   Highest education level: Not on file  Occupational History   Not on file  Tobacco Use   Smoking status: Never   Smokeless tobacco: Never  Substance and Sexual Activity   Alcohol use: Yes    Comment: occasionally   Drug use: Never   Sexual activity: Not on file  Other Topics Concern   Not on file  Social History Narrative   Lives alone   Social Determinants of Health   Financial Resource Strain: Not on file  Food Insecurity: Not on file  Transportation Needs: Not on file  Physical Activity: Not on file  Stress: Not on file  Social  Connections: Not on file  Intimate Partner Violence: Not on file    Family History  Problem Relation Age of Onset   Breast cancer Cousin        mat cousin   Bladder Cancer Mother    Bladder Cancer Maternal Uncle     No Known Allergies  Review of Systems  All other systems reviewed and are negative.      Objective:   BP 120/82   Pulse (!) 106   Ht 5\' 4"  (1.626 m)   Wt 186 lb (84.4 kg)   SpO2 96%   BMI 31.93 kg/m   Vitals:   03/11/23 0957  BP: 120/82  Pulse: (!) 106  Height: 5\' 4"  (1.626 m)  Weight: 186 lb (84.4 kg)  SpO2: 96%  BMI (Calculated): 31.91    Physical Exam Vitals and nursing note reviewed.  Constitutional:      Appearance: Normal appearance. She is normal weight.  HENT:     Head: Normocephalic.  Eyes:     Extraocular Movements: Extraocular movements intact.     Conjunctiva/sclera: Conjunctivae normal.     Pupils: Pupils are equal, round, and reactive to light.  Cardiovascular:     Rate and Rhythm: Normal rate.  Pulmonary:     Effort: Pulmonary  effort is normal.  Neurological:     General: No focal deficit present.     Mental Status: She is alert and oriented to person, place, and time. Mental status is at baseline.  Psychiatric:        Mood and Affect: Mood normal.        Behavior: Behavior normal.        Thought Content: Thought content normal.      No results found for any visits on 03/11/23.  No results found for this or any previous visit (from the past 2160 hour(s)).     Assessment & Plan:   Problem List Items Addressed This Visit       Active Problems   Mixed hyperlipidemia (Chronic)    Checking labs today.  Continue current therapy for lipid control. Will modify as needed based on labwork results.       Relevant Orders   Lipid panel   CMP14+EGFR   CBC with Diff   Vitamin D deficiency, unspecified - Primary    Checking labs today.  Will continue supplements as needed.       Relevant Orders   VITAMIN D 25  Hydroxy (Vit-D Deficiency, Fractures)   CMP14+EGFR   CBC with Diff   Prediabetes (Chronic)    A1C Continues to be in prediabetic ranges.  Will reassess at follow up after next lab check.  Patient counseled on dietary choices and verbalized understanding.  Patient educated on foods that contain carbohydrates and the need to decrease intake.  We discussed prediabetes, and what it means and the need for strict dietary control to prevent progression to type 2 diabetes.  Advised to decrease intake of sugary drinks, including sodas, sweet tea, and some juices, and of starch and sugar heavy foods (ie., potatoes, rice, bread, pasta, desserts). She verbalizes understanding and agreement with the changes discussed today.        Relevant Orders   CMP14+EGFR   Hemoglobin A1c   CBC with Diff   Essential hypertension, benign (Chronic)    Blood pressure well controlled with current medications.  Continue current therapy.  Will reassess at follow up.       Relevant Orders   CMP14+EGFR   CBC with Diff   Hypothyroidism (acquired) (Chronic)    Patient stable. Labs ordered today.  Well controlled with current therapy.   Continue current meds.        Relevant Orders   CMP14+EGFR   TSH   CBC with Diff   B12 deficiency due to diet    Checking labs today.  Will continue supplements as needed.         Relevant Orders   CMP14+EGFR   Vitamin B12   CBC with Diff    No follow-ups on file.   Total time spent: 30 minutes  Miki Kins, FNP  03/11/2023   This document may have been prepared by [facility] Voice Recognition software and as such may include unintentional dictation errors.

## 2023-03-14 NOTE — Assessment & Plan Note (Signed)
Checking labs today.  Will continue supplements as needed.  

## 2023-03-14 NOTE — Assessment & Plan Note (Signed)
Patient stable. Labs ordered today.  Well controlled with current therapy.   Continue current meds.

## 2023-03-14 NOTE — Assessment & Plan Note (Signed)

## 2023-03-14 NOTE — Assessment & Plan Note (Signed)
Checking labs today.  Continue current therapy for lipid control. Will modify as needed based on labwork results.  

## 2023-03-14 NOTE — Assessment & Plan Note (Signed)
Blood pressure well controlled with current medications.  Continue current therapy.  Will reassess at follow up.  

## 2023-03-16 ENCOUNTER — Other Ambulatory Visit: Payer: BC Managed Care – PPO

## 2023-03-17 LAB — HEMOGLOBIN A1C
Est. average glucose Bld gHb Est-mCnc: 126 mg/dL
Hgb A1c MFr Bld: 6 % — ABNORMAL HIGH (ref 4.8–5.6)

## 2023-03-17 LAB — CBC WITH DIFFERENTIAL/PLATELET
Basophils Absolute: 0.1 10*3/uL (ref 0.0–0.2)
Basos: 2 %
EOS (ABSOLUTE): 0.1 10*3/uL (ref 0.0–0.4)
Eos: 2 %
Hematocrit: 42.2 % (ref 34.0–46.6)
Hemoglobin: 13.5 g/dL (ref 11.1–15.9)
Immature Grans (Abs): 0 10*3/uL (ref 0.0–0.1)
Immature Granulocytes: 0 %
Lymphocytes Absolute: 1.6 10*3/uL (ref 0.7–3.1)
Lymphs: 33 %
MCH: 28.2 pg (ref 26.6–33.0)
MCHC: 32 g/dL (ref 31.5–35.7)
MCV: 88 fL (ref 79–97)
Monocytes Absolute: 0.3 10*3/uL (ref 0.1–0.9)
Monocytes: 6 %
Neutrophils Absolute: 2.7 10*3/uL (ref 1.4–7.0)
Neutrophils: 57 %
Platelets: 319 10*3/uL (ref 150–450)
RBC: 4.78 x10E6/uL (ref 3.77–5.28)
RDW: 13.4 % (ref 11.7–15.4)
WBC: 4.7 10*3/uL (ref 3.4–10.8)

## 2023-03-17 LAB — CMP14+EGFR
ALT: 20 [IU]/L (ref 0–32)
AST: 23 [IU]/L (ref 0–40)
Albumin: 4.5 g/dL (ref 3.8–4.9)
Alkaline Phosphatase: 117 [IU]/L (ref 44–121)
BUN/Creatinine Ratio: 17 (ref 9–23)
BUN: 17 mg/dL (ref 6–24)
Bilirubin Total: 0.8 mg/dL (ref 0.0–1.2)
CO2: 26 mmol/L (ref 20–29)
Calcium: 9.6 mg/dL (ref 8.7–10.2)
Chloride: 103 mmol/L (ref 96–106)
Creatinine, Ser: 1.03 mg/dL — ABNORMAL HIGH (ref 0.57–1.00)
Globulin, Total: 2.5 g/dL (ref 1.5–4.5)
Glucose: 92 mg/dL (ref 70–99)
Potassium: 4.2 mmol/L (ref 3.5–5.2)
Sodium: 142 mmol/L (ref 134–144)
Total Protein: 7 g/dL (ref 6.0–8.5)
eGFR: 63 mL/min/{1.73_m2} (ref >59)

## 2023-03-17 LAB — LIPID PANEL
Chol/HDL Ratio: 2.8 {ratio} (ref 0.0–4.4)
Cholesterol, Total: 169 mg/dL (ref 100–199)
HDL: 61 mg/dL (ref >39)
LDL Chol Calc (NIH): 82 mg/dL (ref 0–99)
Triglycerides: 149 mg/dL (ref 0–149)
VLDL Cholesterol Cal: 26 mg/dL (ref 5–40)

## 2023-03-17 LAB — TSH: TSH: 5.17 u[IU]/mL — ABNORMAL HIGH (ref 0.450–4.500)

## 2023-03-17 LAB — VITAMIN B12: Vitamin B-12: 326 pg/mL (ref 232–1245)

## 2023-03-17 LAB — VITAMIN D 25 HYDROXY (VIT D DEFICIENCY, FRACTURES): Vit D, 25-Hydroxy: 56.8 ng/mL (ref 30.0–100.0)

## 2023-05-07 ENCOUNTER — Other Ambulatory Visit: Payer: Self-pay | Admitting: Family

## 2023-07-07 ENCOUNTER — Ambulatory Visit: Payer: Self-pay

## 2023-07-08 ENCOUNTER — Ambulatory Visit (INDEPENDENT_AMBULATORY_CARE_PROVIDER_SITE_OTHER): Payer: Self-pay | Admitting: Family

## 2023-07-08 DIAGNOSIS — R399 Unspecified symptoms and signs involving the genitourinary system: Secondary | ICD-10-CM | POA: Diagnosis not present

## 2023-07-08 LAB — POCT URINALYSIS DIPSTICK
Bilirubin, UA: NEGATIVE
Glucose, UA: NEGATIVE
Ketones, UA: NEGATIVE
Nitrite, UA: POSITIVE
Protein, UA: NEGATIVE
Spec Grav, UA: >=1.03 — AB (ref 1.010–1.025)
Urobilinogen, UA: 0.2 U/dL
pH, UA: 5.5 (ref 5.0–8.0)

## 2023-07-08 MED ORDER — NITROFURANTOIN MONOHYD MACRO 100 MG PO CAPS: 100.0000 mg | ORAL_CAPSULE | Freq: Two times a day (BID) | ORAL | 0 refills | Status: DC

## 2023-07-08 NOTE — Progress Notes (Signed)
   CHIEF COMPLAINT  UA/ only visit fot UTI     REASON FOR VISIT  Possible UTI, UA Visit Only      ASSESSMENT & PLAN Diagnoses and all orders for this visit:  UTI symptoms -     Cancel: POCT CBG (Fasting - Glucose) -     POCT Urinalysis Dipstick (81002) -     Urinalysis, Routine w reflex microscopic -     Urine Culture  Other orders -     nitrofurantoin, macrocrystal-monohydrate, (MACROBID) 100 MG capsule; Take 1 capsule (100 mg total) by mouth 2 (two) times daily.     Patient notified.  Total time spent: 5 minutes  Miki Kins, FNP 07/08/2023

## 2023-07-09 LAB — URINALYSIS, ROUTINE W REFLEX MICROSCOPIC
Bilirubin, UA: NEGATIVE
Glucose, UA: NEGATIVE
Ketones, UA: NEGATIVE
Nitrite, UA: POSITIVE — AB
Specific Gravity, UA: 1.018 (ref 1.005–1.030)
Urobilinogen, Ur: 0.2 mg/dL (ref 0.2–1.0)
pH, UA: 5 (ref 5.0–7.5)

## 2023-07-09 LAB — MICROSCOPIC EXAMINATION
Casts: NONE SEEN /LPF
WBC, UA: >30 /HPF — AB (ref 0–5)

## 2023-07-11 LAB — URINE CULTURE

## 2023-07-14 ENCOUNTER — Telehealth: Payer: Self-pay | Admitting: Family

## 2023-07-14 NOTE — Telephone Encounter (Signed)
 Patient left VM that she came last week and gave a urine specimen and was started on antibiotics. She has been on the antibiotics for a week and is still experiencing urinary urgency and "pain down under". She wants to know if she needs to come in and be seen or what to do? Please advise.

## 2023-07-16 ENCOUNTER — Telehealth: Payer: Self-pay | Admitting: Family

## 2023-07-16 NOTE — Telephone Encounter (Signed)
 Entered in error

## 2023-07-16 NOTE — Telephone Encounter (Signed)
 Pt called still in pain, scheduling pt to be seen

## 2023-07-17 ENCOUNTER — Encounter: Payer: Self-pay | Admitting: Cardiology

## 2023-07-17 ENCOUNTER — Ambulatory Visit (INDEPENDENT_AMBULATORY_CARE_PROVIDER_SITE_OTHER): Admitting: Cardiology

## 2023-07-17 VITALS — BP 128/74 | HR 70 | Ht 64.0 in | Wt 192.0 lb

## 2023-07-17 DIAGNOSIS — R109 Unspecified abdominal pain: Secondary | ICD-10-CM

## 2023-07-17 DIAGNOSIS — R350 Frequency of micturition: Secondary | ICD-10-CM

## 2023-07-17 DIAGNOSIS — Z0130 Encounter for examination of blood pressure without abnormal findings: Secondary | ICD-10-CM

## 2023-07-17 LAB — POCT URINALYSIS DIPSTICK
Bilirubin, UA: NEGATIVE
Glucose, UA: NEGATIVE
Ketones, UA: NEGATIVE
Leukocytes, UA: NEGATIVE
Nitrite, UA: NEGATIVE
Protein, UA: NEGATIVE
Spec Grav, UA: 1.01 (ref 1.010–1.025)
Urobilinogen, UA: 0.2 U/dL
pH, UA: 5.5 (ref 5.0–8.0)

## 2023-07-17 MED ORDER — PHENAZOPYRIDINE HCL 200 MG PO TABS: 200.0000 mg | ORAL_TABLET | Freq: Three times a day (TID) | ORAL | 0 refills | Status: DC | PRN

## 2023-07-17 MED ORDER — ESCITALOPRAM OXALATE 10 MG PO TABS: 10.0000 mg | ORAL_TABLET | Freq: Every day | ORAL | 0 refills | Status: DC

## 2023-07-17 MED ORDER — TAMSULOSIN HCL 0.4 MG PO CAPS: 0.4000 mg | ORAL_CAPSULE | Freq: Every day | ORAL | 0 refills | Status: AC

## 2023-07-17 NOTE — Progress Notes (Signed)
 Established Patient Office Visit  Subjective:  Patient ID: Valerie Archer, female    DOB: Mar 29, 1966  Age: 58 y.o. MRN: [id number]  Chief Complaint  Patient presents with   Urinary Tract Infection    ABX finished- still having frequent pain and urination, pt thinks its a kidney stone   Follow-up    Pt is wanting to change from Wellbutrin to lexapro, please advise with pt    Patient in office for an acute visit, complaining of a UTI. Patient treated for a UTI on 07/08/2023, continues to have symptoms. UA today 3+ for blood indicating possible kidney stone. Will send urine for culture. Will order a CT abdomen and pelvis without contrast to verify kidney stone. Flomax sent in.  Patient requesting to start Lexapro, in place of Wellbutrin. Will send in 10 mg Lexapro. Follow up with regular provider in one week.  Urinary Tract Infection  This is a new problem. The current episode started 1 to 4 weeks ago. The problem occurs every urination. The problem has been waxing and waning. The quality of the pain is described as aching. There has been no fever. Associated symptoms include flank pain, hematuria and urgency. She has tried antibiotics for the symptoms. The treatment provided mild relief.    No other concerns at this time.   Past Medical History:  Diagnosis Date   ADHD (attention deficit hyperactivity disorder)    Anxiety    Depression    History of kidney stones    Hypothyroidism    Pre-diabetes    Thyroid disease     Past Surgical History:  Procedure Laterality Date   ABDOMINAL HYSTERECTOMY     CESAREAN SECTION     COLONOSCOPY W/ POLYPECTOMY     KNEE ARTHROSCOPY Right 08/21/2021   Procedure: ARTHROSCOPY KNEE, medial menisectomy;  Surgeon: Donato Heinz, MD;  Location: [facility];  Service: Orthopedics;  Laterality: Right;    Social History   Socioeconomic History   Marital status: Married    Spouse name: Not on file   Number of children: Not on file   Years of education:  Not on file   Highest education level: Not on file  Occupational History   Not on file  Tobacco Use   Smoking status: Never   Smokeless tobacco: Never  Substance and Sexual Activity   Alcohol use: Yes    Comment: occasionally   Drug use: Never   Sexual activity: Not on file  Other Topics Concern   Not on file  Social History Narrative   Lives alone   Social Drivers of Health   Financial Resource Strain: Not on file  Food Insecurity: Not on file  Transportation Needs: Not on file  Physical Activity: Not on file  Stress: Not on file  Social Connections: Not on file  Intimate Partner Violence: Not on file    Family History  Problem Relation Age of Onset   Breast cancer Cousin        mat cousin   Bladder Cancer Mother    Bladder Cancer Maternal Uncle     No Known Allergies  Outpatient Medications Prior to Visit  Medication Sig   amphetamine-dextroamphetamine (ADDERALL) 15 MG tablet Take 1 tablet by mouth daily.   baclofen (LIORESAL) 10 MG tablet TAKE 1/2 TO 1 TABLET BY MOUTH AT NIGHT AS NEEDED FOR MUSCLE TENSION/PAIN IN NECK/SHOULDERS.   Evolocumab (REPATHA) 140 MG/ML SOSY INJECT 1 SYRINGE EVERY 2 WEEKS. (Patient not taking: Reported on 03/11/2023)  levothyroxine (SYNTHROID) 75 MCG tablet TAKE 1 TABLET BY MOUTH EVERY DAY IN THE MORNING   meloxicam (MOBIC) 7.5 MG tablet Take 7.5 mg by mouth daily as needed for pain.   Vitamin D, Ergocalciferol, (DRISDOL) 1.25 MG (50000 UNIT) CAPS capsule Take 1 capsule (50,000 Units total) by mouth every 7 (seven) days.   [DISCONTINUED] buPROPion (WELLBUTRIN XL) 300 MG 24 hr tablet Take 1 tablet (300 mg total) by mouth every morning.   [DISCONTINUED] nitrofurantoin, macrocrystal-monohydrate, (MACROBID) 100 MG capsule Take 1 capsule (100 mg total) by mouth 2 (two) times daily.   No facility-administered medications prior to visit.    Review of Systems  Constitutional: Negative.   HENT: Negative.    Eyes: Negative.   Respiratory:  Negative.  Negative for shortness of breath.   Cardiovascular: Negative.  Negative for chest pain.  Gastrointestinal: Negative.  Negative for abdominal pain, constipation and diarrhea.  Genitourinary:  Positive for dysuria, flank pain, hematuria and urgency.  Musculoskeletal:  Negative for joint pain and myalgias.  Skin: Negative.   Neurological: Negative.  Negative for dizziness and headaches.  Endo/Heme/Allergies: Negative.   All other systems reviewed and are negative.      Objective:   BP 128/74   Pulse 70   Ht 5\' 4"  (1.626 m)   Wt 192 lb (87.1 kg)   SpO2 98%   BMI 32.96 kg/m   Vitals:   07/17/23 1006  BP: 128/74  Pulse: 70  Height: 5\' 4"  (1.626 m)  Weight: 192 lb (87.1 kg)  SpO2: 98%  BMI (Calculated): 32.94    Physical Exam Vitals and nursing note reviewed.  Constitutional:      Appearance: Normal appearance. She is normal weight.  HENT:     Head: Normocephalic and atraumatic.     Nose: Nose normal.     Mouth/Throat:     Mouth: Mucous membranes are moist.  Eyes:     Extraocular Movements: Extraocular movements intact.     Conjunctiva/sclera: Conjunctivae normal.     Pupils: Pupils are equal, round, and reactive to light.  Cardiovascular:     Rate and Rhythm: Normal rate and regular rhythm.     Pulses: Normal pulses.     Heart sounds: Normal heart sounds.  Pulmonary:     Effort: Pulmonary effort is normal.     Breath sounds: Normal breath sounds.  Abdominal:     General: Abdomen is flat. Bowel sounds are normal.     Palpations: Abdomen is soft.  Musculoskeletal:        General: Normal range of motion.     Cervical back: Normal range of motion.  Skin:    General: Skin is warm and dry.  Neurological:     General: No focal deficit present.     Mental Status: She is alert and oriented to person, place, and time.  Psychiatric:        Mood and Affect: Mood normal.        Behavior: Behavior normal.        Thought Content: Thought content normal.         Judgment: Judgment normal.      Results for orders placed or performed in visit on 07/17/23  POCT Urinalysis Dipstick (81002)  Result Value Ref Range   Color, UA Light Yellow    Clarity, UA CLEAR    Glucose, UA Negative Negative   Bilirubin, UA Negative    Ketones, UA Negative    Spec Grav, UA 1.010 1.010 -  1.025   Blood, UA 3+    pH, UA 5.5 5.0 - 8.0   Protein, UA Negative Negative   Urobilinogen, UA 0.2 0.2 or 1.0 E.U./dL   Nitrite, UA Negative    Leukocytes, UA Negative Negative   Appearance Clear    Odor Yes     Recent Results (from the past 2160 hours)  POCT Urinalysis Dipstick ([id number])     Status: Abnormal   Collection Time: 07/08/23 10:15 AM  Result Value Ref Range   Color, UA YELLOW    Clarity, UA CLOUDY    Glucose, UA Negative Negative   Bilirubin, UA NEGATIVE    Ketones, UA NEGATIVE    Spec Grav, UA >=1.030 (A) 1.010 - 1.025   Blood, UA 3+    pH, UA 5.5 5.0 - 8.0   Protein, UA Negative Negative   Urobilinogen, UA 0.2 0.2 or 1.0 E.U./dL   Nitrite, UA POSITIVE    Leukocytes, UA Moderate (2+) (A) Negative   Appearance CLOUDY    Odor N/A   Urinalysis, Routine w reflex microscopic     Status: Abnormal   Collection Time: 07/08/23 10:44 AM  Result Value Ref Range   Specific Gravity, UA 1.018 1.005 - 1.030   pH, UA 5.0 5.0 - 7.5   Color, UA Yellow Yellow   Appearance Ur Clear Clear   Leukocytes,UA 3+ (A) Negative   Protein,UA Trace Negative/Trace   Glucose, UA Negative Negative   Ketones, UA Negative Negative   RBC, UA 2+ (A) Negative   Bilirubin, UA Negative Negative   Urobilinogen, Ur 0.2 0.2 - 1.0 mg/dL   Nitrite, UA Positive (A) Negative   Microscopic Examination See below:     Comment: Microscopic was indicated and was performed.  Urine Culture     Status: Abnormal   Collection Time: 07/08/23 10:44 AM   Specimen: Urine, Clean Catch   UR  Result Value Ref Range   Urine Culture, Routine Final report (A)    Organism ID, Bacteria Escherichia coli  (A)     Comment: Cefazolin with an MIC <=16 predicts susceptibility to the oral agents cefaclor, cefdinir, cefpodoxime, cefprozil, cefuroxime, cephalexin, and loracarbef when used for therapy of uncomplicated urinary tract infections due to E. coli, Klebsiella pneumoniae, and Proteus mirabilis. Greater than 100,000 colony forming units per mL    Antimicrobial Susceptibility Comment     Comment:       ** S = Susceptible; I = Intermediate; R = Resistant **                    P = Positive; N = Negative             MICS are expressed in micrograms per mL    Antibiotic                 RSLT#1    RSLT#2    RSLT#3    RSLT#4 Amoxicillin/Clavulanic Acid    S Ampicillin                     R Cefazolin                      S Cefepime                       S Cefoxitin  S Cefpodoxime                    S Ceftriaxone                    S Ciprofloxacin                  S Ertapenem                      S Gentamicin                     S Levofloxacin                   S Meropenem                      S Nitrofurantoin                 S Piperacillin/Tazobactam        S Tetracycline                   S Tobramycin                     S Trimethoprim/Sulfa             S   Microscopic Examination     Status: Abnormal   Collection Time: 07/08/23 10:44 AM  Result Value Ref Range   WBC, UA >30 (A) 0 - 5 /hpf   RBC, Urine 3-10 (A) 0 - 2 /hpf   Epithelial Cells (non renal) 0-10 0 - 10 /hpf   Casts None seen None seen /lpf   Bacteria, UA Many (A) None seen/Few  POCT Urinalysis Dipstick ([id number])     Status: None   Collection Time: 07/17/23 10:27 AM  Result Value Ref Range   Color, UA Light Yellow    Clarity, UA CLEAR    Glucose, UA Negative Negative   Bilirubin, UA Negative    Ketones, UA Negative    Spec Grav, UA 1.010 1.010 - 1.025   Blood, UA 3+    pH, UA 5.5 5.0 - 8.0   Protein, UA Negative Negative   Urobilinogen, UA 0.2 0.2 or 1.0 E.U./dL   Nitrite, UA Negative     Leukocytes, UA Negative Negative   Appearance Clear    Odor Yes       Assessment & Plan:  CT abdomen and pelvis without contrast Flomax Lexapro Urine sent for culture  Problem List Items Addressed This Visit       Other   Urination frequency - Primary   Relevant Orders   POCT Urinalysis Dipstick ([id number]) (Completed)   Urine Culture   Other Visit Diagnoses       Abdominal pain, unspecified abdominal location       Relevant Orders   CT ABDOMEN PELVIS WO CONTRAST       Return in about 1 week (around 07/24/2023) for with Marchelle Folks.   Total time spent: 25 minutes  [employer], NP  07/17/2023   This document may have been prepared by Dragon Voice Recognition software and as such may include unintentional dictation errors.

## 2023-07-17 NOTE — Telephone Encounter (Signed)
 Patient has appt with [facility]

## 2023-07-20 LAB — URINE CULTURE

## 2023-07-20 LAB — SPECIMEN STATUS REPORT

## 2023-07-21 ENCOUNTER — Telehealth: Payer: Self-pay | Admitting: Family

## 2023-07-21 ENCOUNTER — Ambulatory Visit
Admission: RE | Admit: 2023-07-21 | Discharge: 2023-07-21 | Disposition: A | Discharge status: Home / Self Care | Payer: Self-pay | Source: Ambulatory Visit | Attending: Cardiology | Admitting: Cardiology

## 2023-07-21 ENCOUNTER — Other Ambulatory Visit: Payer: Self-pay | Admitting: Cardiology

## 2023-07-21 DIAGNOSIS — R109 Unspecified abdominal pain: Secondary | ICD-10-CM

## 2023-07-21 MED ORDER — CIPROFLOXACIN HCL 500 MG PO TABS: 500.0000 mg | ORAL_TABLET | Freq: Two times a day (BID) | ORAL | 0 refills | Status: AC

## 2023-07-21 NOTE — Telephone Encounter (Signed)
 PT called in regards to her UA results & her CT for today she is getting done at [facility] PT wanted to know that since she got her UA results back, and them being ecoli, does she still nedd to take the antibiotic and get CT scan done

## 2023-07-28 ENCOUNTER — Ambulatory Visit (INDEPENDENT_AMBULATORY_CARE_PROVIDER_SITE_OTHER): Admitting: Family

## 2023-07-28 DIAGNOSIS — R300 Dysuria: Secondary | ICD-10-CM

## 2023-07-28 LAB — POCT URINALYSIS DIPSTICK
Bilirubin, UA: NEGATIVE
Glucose, UA: NEGATIVE
Ketones, UA: NEGATIVE
Leukocytes, UA: NEGATIVE
Nitrite, UA: NEGATIVE
Protein, UA: NEGATIVE
Spec Grav, UA: 1.02 (ref 1.010–1.025)
Urobilinogen, UA: 0.2 U/dL
pH, UA: 5.5 (ref 5.0–8.0)

## 2023-07-29 LAB — URINALYSIS, ROUTINE W REFLEX MICROSCOPIC
Bilirubin, UA: NEGATIVE
Glucose, UA: NEGATIVE
Ketones, UA: NEGATIVE
Nitrite, UA: NEGATIVE
Protein,UA: NEGATIVE
Specific Gravity, UA: 1.019 (ref 1.005–1.030)
Urobilinogen, Ur: 0.2 mg/dL (ref 0.2–1.0)
pH, UA: 5.5 (ref 5.0–7.5)

## 2023-07-29 LAB — MICROSCOPIC EXAMINATION
Bacteria, UA: NONE SEEN
Casts: NONE SEEN /LPF
RBC, Urine: >30 /HPF — AB (ref 0–2)
WBC, UA: NONE SEEN /HPF (ref 0–5)

## 2023-07-30 LAB — URINE CULTURE

## 2023-08-03 ENCOUNTER — Other Ambulatory Visit: Payer: Self-pay | Admitting: Family

## 2023-08-03 ENCOUNTER — Ambulatory Visit (INDEPENDENT_AMBULATORY_CARE_PROVIDER_SITE_OTHER): Admitting: Family

## 2023-08-03 ENCOUNTER — Encounter: Payer: Self-pay | Admitting: Family

## 2023-08-03 VITALS — BP 122/76 | HR 82 | Ht 64.0 in | Wt 192.0 lb

## 2023-08-03 DIAGNOSIS — R7303 Prediabetes: Secondary | ICD-10-CM | POA: Diagnosis not present

## 2023-08-03 DIAGNOSIS — I10 Essential (primary) hypertension: Secondary | ICD-10-CM | POA: Diagnosis not present

## 2023-08-03 DIAGNOSIS — B356 Tinea cruris: Secondary | ICD-10-CM | POA: Diagnosis not present

## 2023-08-03 DIAGNOSIS — N9419 Other specified dyspareunia: Secondary | ICD-10-CM

## 2023-08-03 DIAGNOSIS — Z780 Asymptomatic menopausal state: Secondary | ICD-10-CM

## 2023-08-03 DIAGNOSIS — E538 Deficiency of other specified B group vitamins: Secondary | ICD-10-CM

## 2023-08-03 DIAGNOSIS — E782 Mixed hyperlipidemia: Secondary | ICD-10-CM | POA: Diagnosis not present

## 2023-08-03 DIAGNOSIS — E039 Hypothyroidism, unspecified: Secondary | ICD-10-CM

## 2023-08-03 DIAGNOSIS — E559 Vitamin D deficiency, unspecified: Secondary | ICD-10-CM

## 2023-08-03 MED ORDER — ESTROGENS CONJUGATED 0.625 MG/GM VA CREA: 1.0000 | TOPICAL_CREAM | Freq: Every day | VAGINAL | 0 refills | Status: DC

## 2023-08-03 MED ORDER — BACLOFEN 10 MG PO TABS: ORAL_TABLET | ORAL | 1 refills | Status: AC

## 2023-08-03 MED ORDER — NYSTATIN 100000 UNIT/GM EX OINT: 1.0000 | TOPICAL_OINTMENT | Freq: Two times a day (BID) | CUTANEOUS | 0 refills | Status: AC

## 2023-08-03 MED ORDER — VITAMIN D (ERGOCALCIFEROL) 1.25 MG (50000 UNIT) PO CAPS: 50000.0000 [IU] | ORAL_CAPSULE | ORAL | 3 refills | Status: AC

## 2023-08-03 NOTE — Assessment & Plan Note (Signed)
 Checking labs today.  Continue current therapy for lipid control. Will modify as needed based on labwork results.

## 2023-08-03 NOTE — Assessment & Plan Note (Signed)
 Checking labs today.  Will continue supplements as needed.

## 2023-08-03 NOTE — Assessment & Plan Note (Signed)
 Blood pressure well controlled with current medications.  Continue current therapy.  Will reassess at follow up.

## 2023-08-03 NOTE — Assessment & Plan Note (Signed)

## 2023-08-03 NOTE — Progress Notes (Signed)
 Established Patient Office Visit  Subjective:  Patient ID: Valerie Archer, female    DOB: 24-Jan-1966  Age: 58 y.o. MRN: [id number]  Chief Complaint  Patient presents with   Follow-up    2 week follow up, CT results.    Patient is here today for her s follow up.  She has been feeling fairly well since last appointment.   She does have additional concerns to discuss today.  She had her CT scan, and they did not find any stones or any other concerns.  She also says that she has had some discomfort in her genital area, says that she has been specifically having itches.  She also has been having dyspareunia.  We have discussed before, and she says that it has not improved, despite using the methods we discussed previously. She asks if we can try the estrogen cream.  Labs are due today. She needs refills.   I have reviewed her active problem list, medication list, allergies, notes from last encounter, lab results for her appointment today.      No other concerns at this time.   Past Medical History:  Diagnosis Date   ADHD (attention deficit hyperactivity disorder)    Anxiety    Depression    History of kidney stones    Hypothyroidism    Pre-diabetes    Thyroid disease     Past Surgical History:  Procedure Laterality Date   ABDOMINAL HYSTERECTOMY     CESAREAN SECTION     COLONOSCOPY W/ POLYPECTOMY     KNEE ARTHROSCOPY Right 08/21/2021   Procedure: ARTHROSCOPY KNEE, medial menisectomy;  Surgeon: Arlyne Lame, MD;  Location: [facility] OR[facility];  Service: Orthopedics;  Laterality: Right;    Social History   Socioeconomic History   Marital status: Married    Spouse name: Not on file   Number of children: Not on file   Years of education: Not on file   Highest education level: Not on file  Occupational History   Not on file  Tobacco Use   Smoking status: Never   Smokeless tobacco: Never  Substance and Sexual Activity   Alcohol use: Yes    Comment: occasionally   Drug  use: Never   Sexual activity: Not on file  Other Topics Concern   Not on file  Social History Narrative   Lives alone   Social Drivers of Health   Financial Resource Strain: Not on file  Food Insecurity: Not on file  Transportation Needs: Not on file  Physical Activity: Not on file  Stress: Not on file  Social Connections: Not on file  Intimate Partner Violence: Not on file    Family History  Problem Relation Age of Onset   Breast cancer Cousin        mat cousin   Bladder Cancer Mother    Bladder Cancer Maternal Uncle     No Known Allergies  Review of Systems  Genitourinary:  Positive for dysuria and urgency.       Discomfort with intercourse.  All other systems reviewed and are negative.      Objective:   BP 122/76   Pulse 82   Ht 5\' 4"  (1.626 m)   Wt 192 lb (87.1 kg)   SpO2 96%   BMI 32.96 kg/m   Vitals:   08/03/23 0921  BP: 122/76  Pulse: 82  Height: 5\' 4"  (1.626 m)  Weight: 192 lb (87.1 kg)  SpO2: 96%  BMI (Calculated): 32.94  Physical Exam Vitals and nursing note reviewed.  Constitutional:      Appearance: Normal appearance. She is normal weight.  HENT:     Head: Normocephalic and atraumatic.     Nose: Nose normal.  Eyes:     Extraocular Movements: Extraocular movements intact.     Conjunctiva/sclera: Conjunctivae normal.     Pupils: Pupils are equal, round, and reactive to light.  Cardiovascular:     Rate and Rhythm: Normal rate.     Pulses: Normal pulses.  Pulmonary:     Effort: Pulmonary effort is normal.  Neurological:     General: No focal deficit present.     Mental Status: She is alert and oriented to person, place, and time. Mental status is at baseline.  Psychiatric:        Mood and Affect: Mood normal.        Behavior: Behavior normal.        Thought Content: Thought content normal.        Judgment: Judgment normal.      No results found for any visits on 08/03/23.  Recent Results (from the past 2160 hours)  POCT  Urinalysis Dipstick (40981)     Status: Abnormal   Collection Time: 07/08/23 10:15 AM  Result Value Ref Range   Color, UA YELLOW    Clarity, UA CLOUDY    Glucose, UA Negative Negative   Bilirubin, UA NEGATIVE    Ketones, UA NEGATIVE    Spec Grav, UA >=1.030 (A) 1.010 - 1.025   Blood, UA 3+    pH, UA 5.5 5.0 - 8.0   Protein, UA Negative Negative   Urobilinogen, UA 0.2 0.2 or 1.0 E.U./dL   Nitrite, UA POSITIVE    Leukocytes, UA Moderate (2+) (A) Negative   Appearance CLOUDY    Odor N/A   Urinalysis, Routine w reflex microscopic     Status: Abnormal   Collection Time: 07/08/23 10:44 AM  Result Value Ref Range   Specific Gravity, UA 1.018 1.005 - 1.030   pH, UA 5.0 5.0 - 7.5   Color, UA Yellow Yellow   Appearance Ur Clear Clear   Leukocytes,UA 3+ (A) Negative   Protein,UA Trace Negative/Trace   Glucose, UA Negative Negative   Ketones, UA Negative Negative   RBC, UA 2+ (A) Negative   Bilirubin, UA Negative Negative   Urobilinogen, Ur 0.2 0.2 - 1.0 mg/dL   Nitrite, UA Positive (A) Negative   Microscopic Examination See below:     Comment: Microscopic was indicated and was performed.  Urine Culture     Status: Abnormal   Collection Time: 07/08/23 10:44 AM   Specimen: Urine, Clean Catch   UR  Result Value Ref Range   Urine Culture, Routine Final report (A)    Organism ID, Bacteria Escherichia coli (A)     Comment: Cefazolin with an MIC <=16 predicts susceptibility to the oral agents cefaclor, cefdinir, cefpodoxime, cefprozil, cefuroxime, cephalexin, and loracarbef when used for therapy of uncomplicated urinary tract infections due to E. coli, Klebsiella pneumoniae, and Proteus mirabilis. Greater than 100,000 colony forming units per mL    Antimicrobial Susceptibility Comment     Comment:       ** S = Susceptible; I = Intermediate; R = Resistant **                    P = Positive; N = Negative             MICS are  expressed in micrograms per mL    Antibiotic                  RSLT#1    RSLT#2    RSLT#3    RSLT#4 Amoxicillin/Clavulanic Acid    S Ampicillin                     R Cefazolin                      S Cefepime                       S Cefoxitin                      S Cefpodoxime                    S Ceftriaxone                    S Ciprofloxacin                   S Ertapenem                      S Gentamicin                     S Levofloxacin                   S Meropenem                      S Nitrofurantoin                  S Piperacillin/Tazobactam        S Tetracycline                   S Tobramycin                     S Trimethoprim/Sulfa             S   Microscopic Examination     Status: Abnormal   Collection Time: 07/08/23 10:44 AM  Result Value Ref Range   WBC, UA >30 (A) 0 - 5 /hpf   RBC, Urine 3-10 (A) 0 - 2 /hpf   Epithelial Cells (non renal) 0-10 0 - 10 /hpf   Casts None seen None seen /lpf   Bacteria, UA Many (A) None seen/Few  Urine Culture     Status: Abnormal   Collection Time: 07/17/23 12:00 AM   Specimen: Urine   Urine  Result Value Ref Range   Urine Culture, Routine Final report (A)    Organism ID, Bacteria Escherichia coli (A)     Comment: Cefazolin with an MIC <=16 predicts susceptibility to the oral agents cefaclor, cefdinir, cefpodoxime, cefprozil, cefuroxime, cephalexin, and loracarbef when used for therapy of uncomplicated urinary tract infections due to E. coli, Klebsiella pneumoniae, and Proteus mirabilis. 25,000-50,000 colony forming units per mL    Antimicrobial Susceptibility Comment     Comment:       ** S = Susceptible; I = Intermediate; R = Resistant **                    P = Positive; N = Negative             MICS are expressed in  micrograms per mL    Antibiotic                 RSLT#1    RSLT#2    RSLT#3    RSLT#4 Amoxicillin/Clavulanic Acid    I Ampicillin                     R Cefazolin                      S Cefepime                       S Cefoxitin                      S Cefpodoxime                     S Ceftriaxone                    S Ciprofloxacin                   S Ertapenem                      S Gentamicin                     S Levofloxacin                   S Meropenem                      S Nitrofurantoin                  S Piperacillin/Tazobactam        S Tetracycline                   S Tobramycin                     S Trimethoprim/Sulfa             S   Specimen status report     Status: None   Collection Time: 07/17/23 12:00 AM  Result Value Ref Range   specimen status report Comment     Comment: Please note Please note The date and/or time of collection was not indicated on the requisition as required by state and federal law.  The date of receipt of the specimen was used as the collection date if not supplied.   POCT Urinalysis Dipstick ([id number])     Status: None   Collection Time: 07/17/23 10:27 AM  Result Value Ref Range   Color, UA Light Yellow    Clarity, UA CLEAR    Glucose, UA Negative Negative   Bilirubin, UA Negative    Ketones, UA Negative    Spec Grav, UA 1.010 1.010 - 1.025   Blood, UA 3+    pH, UA 5.5 5.0 - 8.0   Protein, UA Negative Negative   Urobilinogen, UA 0.2 0.2 or 1.0 E.U./dL   Nitrite, UA Negative    Leukocytes, UA Negative Negative   Appearance Clear    Odor Yes   POCT Urinalysis Dipstick ([id number])     Status: None   Collection Time: 07/28/23  3:13 PM  Result Value Ref Range   Color, UA Yellow    Clarity, UA Cloudy    Glucose, UA Negative Negative  Bilirubin, UA Negative    Ketones, UA Negative    Spec Grav, UA 1.020 1.010 - 1.025   Blood, UA Moderate    pH, UA 5.5 5.0 - 8.0   Protein, UA Negative Negative   Urobilinogen, UA 0.2 0.2 or 1.0 E.U./dL   Nitrite, UA Negative    Leukocytes, UA Negative Negative   Appearance Cloudy    Odor Yes   Urinalysis, Routine w reflex microscopic     Status: Abnormal   Collection Time: 07/28/23  4:07 PM  Result Value Ref Range   Specific Gravity, UA 1.019 1.005 - 1.030   pH, UA 5.5  5.0 - 7.5   Color, UA Yellow Yellow   Appearance Ur Clear Clear   Leukocytes,UA Trace (A) Negative   Protein,UA Negative Negative/Trace   Glucose, UA Negative Negative   Ketones, UA Negative Negative   RBC, UA 2+ (A) Negative   Bilirubin, UA Negative Negative   Urobilinogen, Ur 0.2 0.2 - 1.0 mg/dL   Nitrite, UA Negative Negative   Microscopic Examination See below:     Comment: Microscopic was indicated and was performed.  Microscopic Examination     Status: Abnormal   Collection Time: 07/28/23  4:07 PM  Result Value Ref Range   WBC, UA None seen 0 - 5 /hpf   RBC, Urine >30 (A) 0 - 2 /hpf   Epithelial Cells (non renal) 0-10 0 - 10 /hpf   Casts None seen None seen /lpf   Bacteria, UA None seen None seen/Few  Urine Culture     Status: None   Collection Time: 07/28/23  4:09 PM   Specimen: Urine, Clean Catch   UR  Result Value Ref Range   Urine Culture, Routine Final report    Organism ID, Bacteria Comment     Comment: Mixed urogenital flora Less than 10,000 colonies/mL        Assessment & Plan:   Problem List Items Addressed This Visit       Cardiovascular and Mediastinum   Essential hypertension, benign (Chronic)   Blood pressure well controlled with current medications.  Continue current therapy.  Will reassess at follow up.        Relevant Orders   CMP14+EGFR   CBC with Diff     Endocrine   Hypothyroidism (acquired) (Chronic)   Checking labs today.  Will continue supplements as needed.        Relevant Orders   CMP14+EGFR   TSH   CBC with Diff     Other   Mixed hyperlipidemia (Chronic)   Checking labs today.  Continue current therapy for lipid control. Will modify as needed based on labwork results.        Relevant Orders   Lipid panel   CMP14+EGFR   CBC with Diff   Vitamin D  deficiency, unspecified   Checking labs today.  Will continue supplements as needed.        Relevant Orders   VITAMIN D  25 Hydroxy (Vit-D Deficiency, Fractures)    CMP14+EGFR   CBC with Diff   Prediabetes (Chronic)   Patient educated on foods that contain carbohydrates and the need to decrease intake.  We discussed prediabetes, and what it means and the need for strict dietary control to prevent progression to type 2 diabetes.  Advised to decrease intake of sugary drinks, including sodas, sweet tea, and some juices, and of starch and sugar heavy foods (ie., potatoes, rice, bread, pasta, desserts). She verbalizes understanding and agreement with the  changes discussed today.  A1C Continues to be in prediabetic ranges.  Will reassess at follow up after next lab check.  Patient counseled on dietary choices and verbalized understanding.        Relevant Orders   CMP14+EGFR   Hemoglobin A1c   CBC with Diff   Menopause   Patient stable.  Well controlled with current therapy.   Continue current meds.        Relevant Orders   CMP14+EGFR   CBC with Diff   B12 deficiency due to diet   Checking labs today.  Will continue supplements as needed.        Relevant Orders   CMP14+EGFR   Vitamin B12   CBC with Diff   Other Visit Diagnoses       Tinea cruris    -  Primary   Sending Nystatin  ointment for her to try.  She will let me know if this does not improve her symptoms.   Relevant Medications   nystatin  ointment (MYCOSTATIN )   Other Relevant Orders   CMP14+EGFR   CBC with Diff     Dyspareunia due to medical condition in female       sending rx for estrace.  Will follow up in 1 month to recheck.   Relevant Medications   nystatin  ointment (MYCOSTATIN )   Other Relevant Orders   CMP14+EGFR   CBC with Diff       Return in about 1 month (around 09/02/2023) for F/U.   Total time spent: 30 minutes  Trenda Frisk, FNP  08/03/2023   This document may have been prepared by [employer] Voice Recognition software and as such may include unintentional dictation errors.

## 2023-08-03 NOTE — Assessment & Plan Note (Signed)
 Patient stable.  Well controlled with current therapy.   Continue current meds.

## 2023-08-04 LAB — CBC WITH DIFFERENTIAL/PLATELET
Basophils Absolute: 0.1 10*3/uL (ref 0.0–0.2)
Basos: 1 %
EOS (ABSOLUTE): 0.1 10*3/uL (ref 0.0–0.4)
Eos: 2 %
Hematocrit: 42.8 % (ref 34.0–46.6)
Hemoglobin: 13.4 g/dL (ref 11.1–15.9)
Immature Grans (Abs): 0 10*3/uL (ref 0.0–0.1)
Immature Granulocytes: 0 %
Lymphocytes Absolute: 1.7 10*3/uL (ref 0.7–3.1)
Lymphs: 39 %
MCH: 27.9 pg (ref 26.6–33.0)
MCHC: 31.3 g/dL — ABNORMAL LOW (ref 31.5–35.7)
MCV: 89 fL (ref 79–97)
Monocytes Absolute: 0.3 10*3/uL (ref 0.1–0.9)
Monocytes: 7 %
Neutrophils Absolute: 2.1 10*3/uL (ref 1.4–7.0)
Neutrophils: 51 %
Platelets: 291 10*3/uL (ref 150–450)
RBC: 4.8 x10E6/uL (ref 3.77–5.28)
RDW: 13.7 % (ref 11.7–15.4)
WBC: 4.2 10*3/uL (ref 3.4–10.8)

## 2023-08-04 LAB — LIPID PANEL
Chol/HDL Ratio: 4.8 ratio — ABNORMAL HIGH (ref 0.0–4.4)
Cholesterol, Total: 237 mg/dL — ABNORMAL HIGH (ref 100–199)
HDL: 49 mg/dL (ref >39)
LDL Chol Calc (NIH): 163 mg/dL — ABNORMAL HIGH (ref 0–99)
Triglycerides: 137 mg/dL (ref 0–149)
VLDL Cholesterol Cal: 25 mg/dL (ref 5–40)

## 2023-08-04 LAB — CMP14+EGFR
ALT: 15 IU/L (ref 0–32)
AST: 19 IU/L (ref 0–40)
Albumin: 4.4 g/dL (ref 3.8–4.9)
Alkaline Phosphatase: 118 IU/L (ref 44–121)
BUN/Creatinine Ratio: 15 (ref 9–23)
BUN: 13 mg/dL (ref 6–24)
Bilirubin Total: 1 mg/dL (ref 0.0–1.2)
CO2: 24 mmol/L (ref 20–29)
Calcium: 9.6 mg/dL (ref 8.7–10.2)
Chloride: 105 mmol/L (ref 96–106)
Creatinine, Ser: 0.86 mg/dL (ref 0.57–1.00)
Globulin, Total: 2.5 g/dL (ref 1.5–4.5)
Glucose: 95 mg/dL (ref 70–99)
Potassium: 4.5 mmol/L (ref 3.5–5.2)
Sodium: 141 mmol/L (ref 134–144)
Total Protein: 6.9 g/dL (ref 6.0–8.5)
eGFR: 78 mL/min/{1.73_m2} (ref >59)

## 2023-08-04 LAB — VITAMIN D 25 HYDROXY (VIT D DEFICIENCY, FRACTURES): Vit D, 25-Hydroxy: 44.3 ng/mL (ref 30.0–100.0)

## 2023-08-04 LAB — VITAMIN B12: Vitamin B-12: 335 pg/mL (ref 232–1245)

## 2023-08-04 LAB — HEMOGLOBIN A1C
Est. average glucose Bld gHb Est-mCnc: 126 mg/dL
Hgb A1c MFr Bld: 6 % — ABNORMAL HIGH (ref 4.8–5.6)

## 2023-08-04 LAB — TSH: TSH: 4.3 u[IU]/mL (ref 0.450–4.500)

## 2023-08-21 ENCOUNTER — Other Ambulatory Visit: Payer: Self-pay

## 2023-08-21 DIAGNOSIS — E782 Mixed hyperlipidemia: Secondary | ICD-10-CM

## 2023-08-21 MED ORDER — REPATHA SURECLICK 140 MG/ML ~~LOC~~ SOAJ
140.0000 mg | SUBCUTANEOUS | 2 refills | Status: DC
Start: 2023-08-21 — End: 2023-11-09

## 2023-09-02 ENCOUNTER — Ambulatory Visit: Admitting: Family

## 2023-09-07 IMAGING — MR MR KNEE*R* W/O CM
5 of 7 series · 25 of 40 positions shown · non-contrast
Comparison: None.

CLINICAL DATA: Right medial/lateral knee pain since, August 2020.

EXAM:
MRI OF THE RIGHT KNEE WITHOUT CONTRAST
TECHNIQUE: Multiplanar, multisequence MR imaging of the knee was performed. No
intravenous contrast was administered.

[Series 3: T2 fat-sat · axial · 4.0mm · 0.62mm/px · z∈[-31,+98]mm · 6 of 28 slices shown (1 of 2)]
[im 1/28]
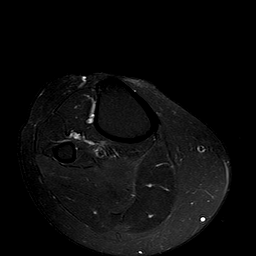
[im 6/28]
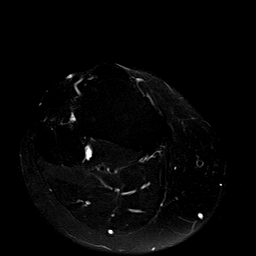
[im 11/28]
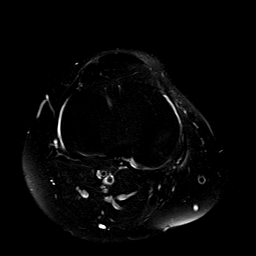
[im 17/28]
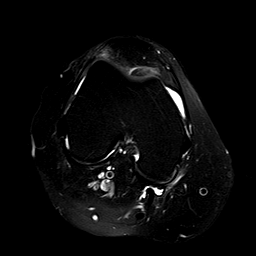
[im 22/28]
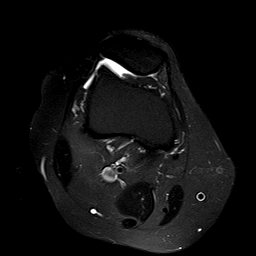
[im 28/28]
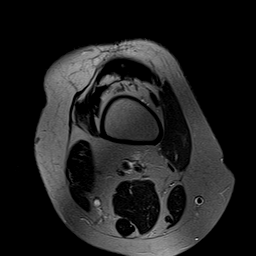

[Series 5: T2 fat-sat · coronal · 4.0mm · 0.29mm/px · 3 of 26 slices shown (2 of 2)]
[im 1/26]
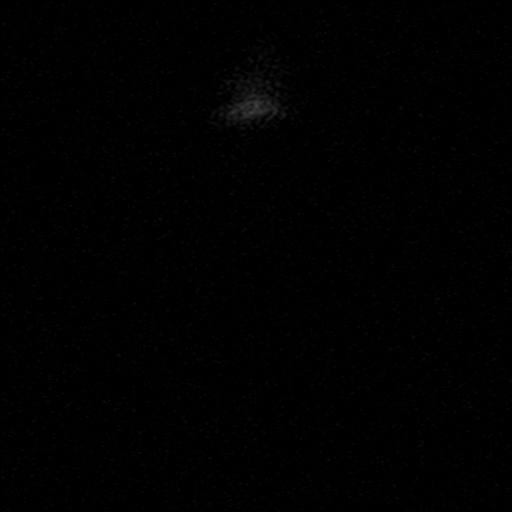
[im 6/26]
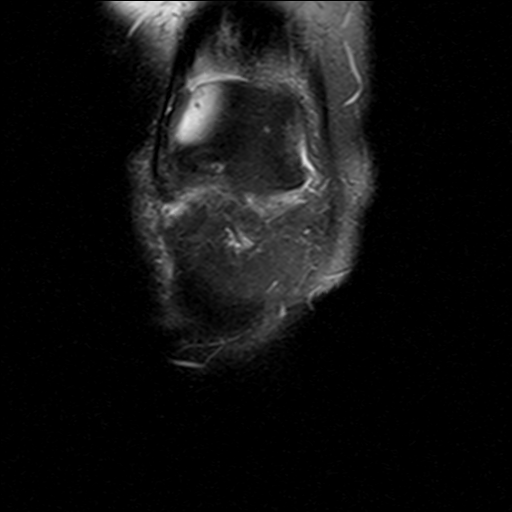
[im 11/26]
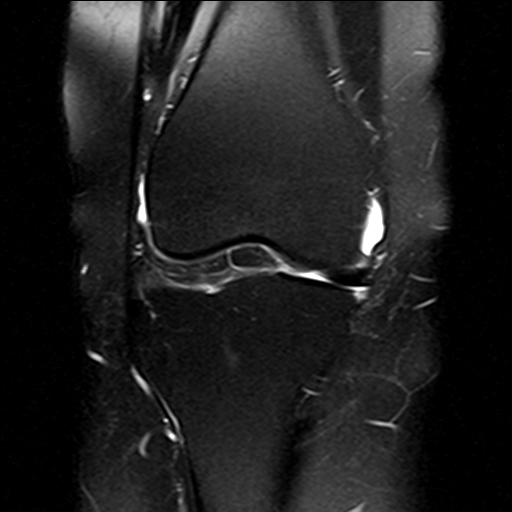

[Series 6: PD fat-sat · coronal · 4.0mm · 0.29mm/px · 6 of 26 slices shown (1 of 3)]
[im 1/26]
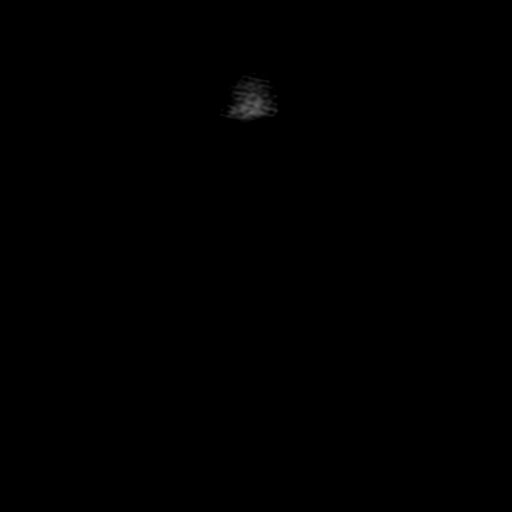
[im 6/26]
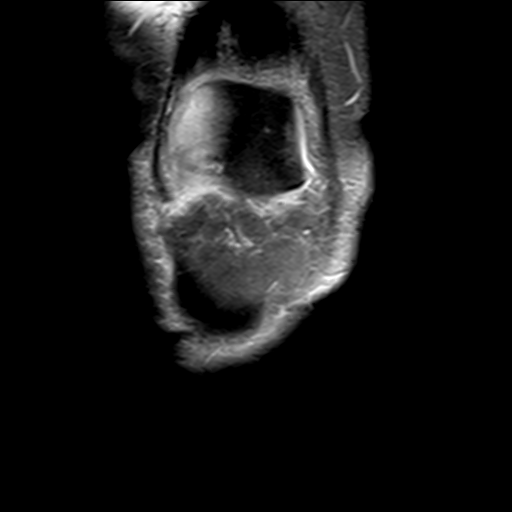
[im 11/26]
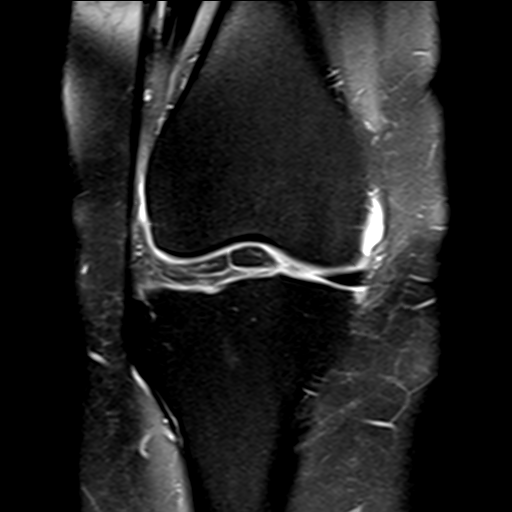
[im 16/26]
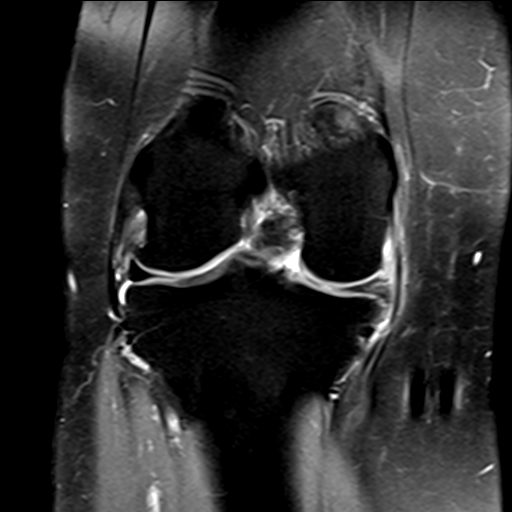
[im 21/26]
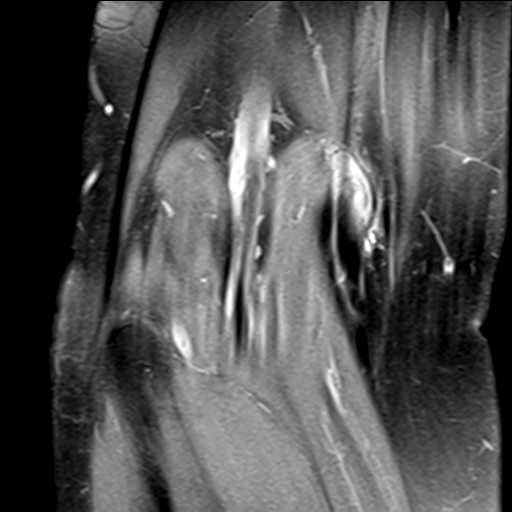
[im 26/26]
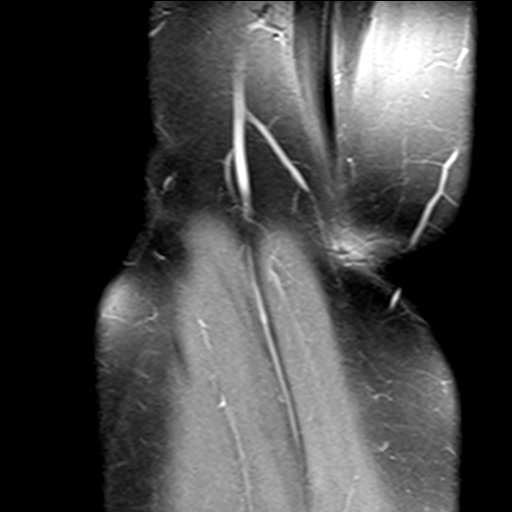

[Series 7: PD fat-sat · sagittal · 3.0mm · 0.29mm/px · 6 of 30 slices shown (2 of 3)]
[im 1/30]
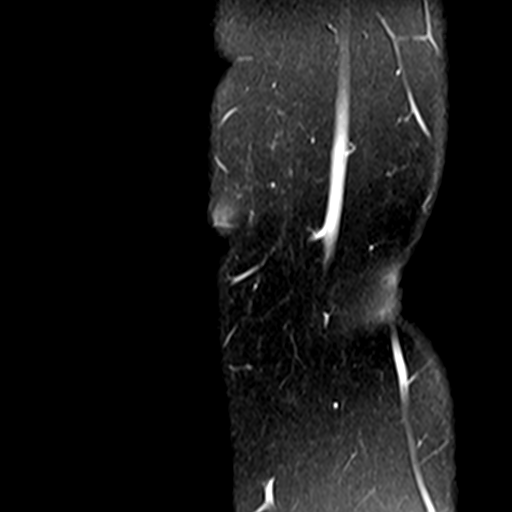
[im 6/30]
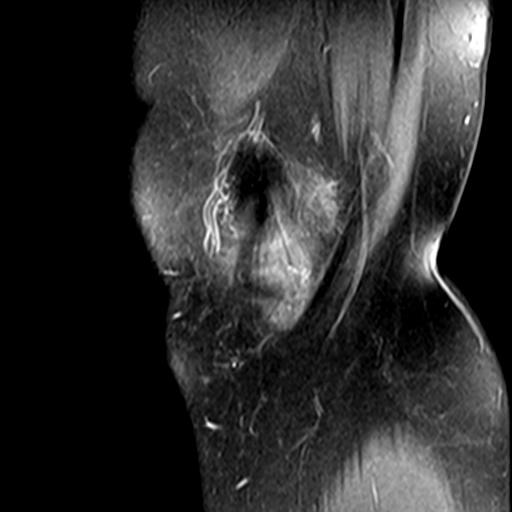
[im 12/30]
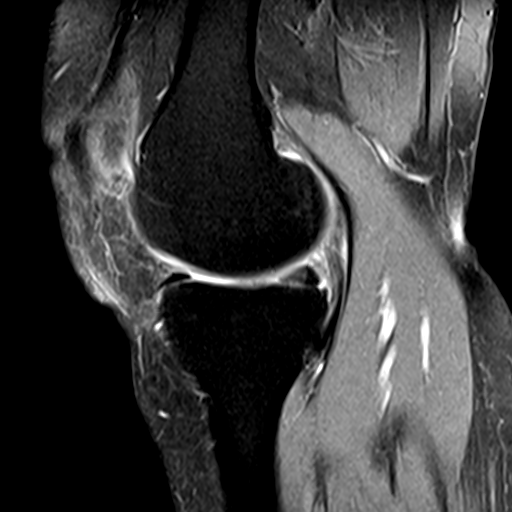
[im 18/30]
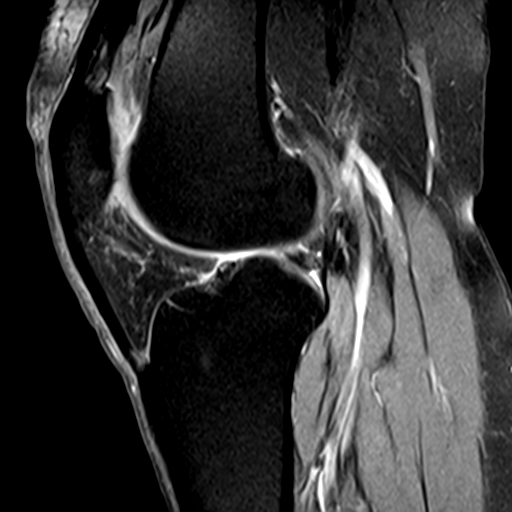
[im 24/30]
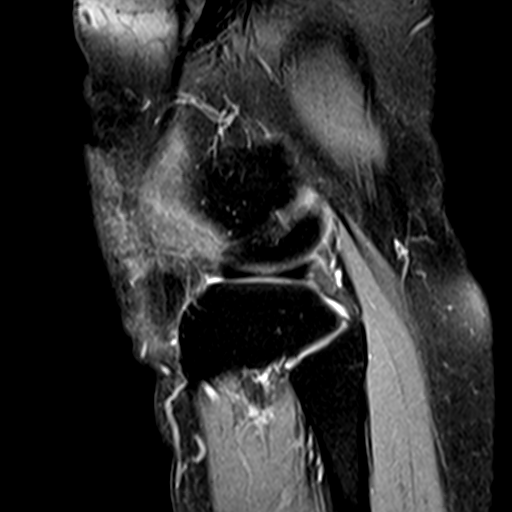
[im 30/30]
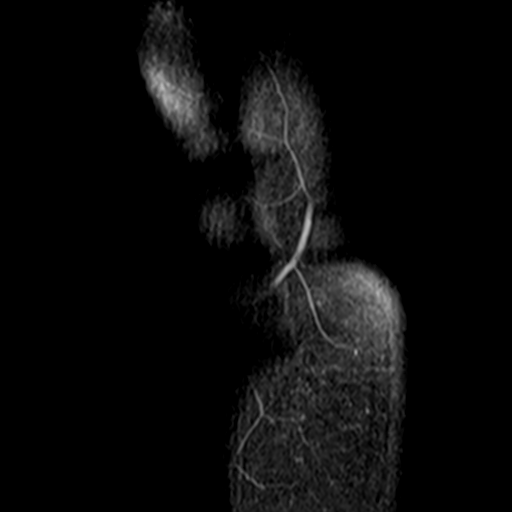

[Series 9: PD fat-sat · coronal · 2.0mm · 0.59mm/px · 4 of 21 slices shown (3 of 3)]
[im 1/21]
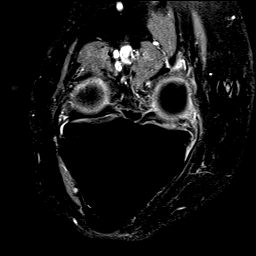
[im 7/21]
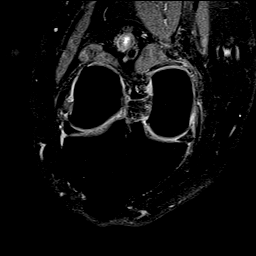
[im 14/21]
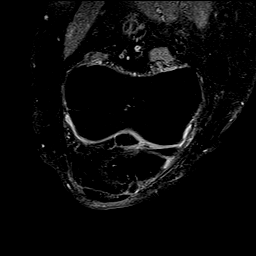
[im 21/21]
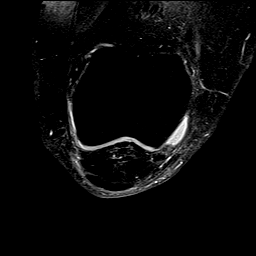

[25 of 40 positions shown; findings below may reference images not displayed]

FINDINGS: MENISCI

Medial meniscus: Irregular free edge and undersurface tearing of the
medial meniscus centered at the posterior horn-body junction (series
7, images 21-23).

Lateral meniscus:  Intact.

LIGAMENTS

Cruciates: Intact ACL and PCL.

Collaterals: Intact MCL with mild periligamentous edema. Lateral
collateral ligament complex intact.

CARTILAGE

Patellofemoral: High-grade cartilage loss along the inferior margin
of the lateral patellar facet.

Medial: Mild chondral thinning of the weight-bearing medial
compartment.

Lateral:  No chondral defect.

MISCELLANEOUS

Joint: Small joint effusion. Mild focal edema within the
superolateral aspect of Hoffa's fat.

Popliteal Fossa:  Small Baker's cyst. Intact popliteus tendon.

Extensor Mechanism: Mild tendinosis of the distal quadriceps tendon
and distal patellar tendons without tear.

Bones: No acute fracture. No dislocation. Mild reactive subchondral
marrow signal changes in the lateral patella. Otherwise, no bone
marrow edema. No marrow replacing bone lesion.

Other: No significant periarticular soft tissue findings.
IMPRESSION: 1. Medial and patellofemoral compartment osteoarthritis with
high-grade cartilage loss along the inferior margin of the lateral
patellar facet.
2. Irregular free edge and undersurface tearing of the medial
meniscus centered at the posterior horn-body junction.
3. Grade 1 MCL sprain.
4. Mild tendinosis of the distal quadriceps tendon and distal
patellar tendon without tear.
5. Small joint effusion and small Baker's cyst.
6. Focal edema within the superolateral aspect of Hoffa's fat pad,
which can be seen in the setting of patellar tendon-lateral femoral
condyle friction syndrome.

## 2023-10-03 ENCOUNTER — Other Ambulatory Visit: Payer: Self-pay | Admitting: Family

## 2023-10-28 ENCOUNTER — Other Ambulatory Visit: Payer: Self-pay | Admitting: Internal Medicine

## 2023-11-02 ENCOUNTER — Other Ambulatory Visit: Payer: Self-pay

## 2023-11-02 MED ORDER — ESCITALOPRAM OXALATE 10 MG PO TABS: 10.0000 mg | ORAL_TABLET | Freq: Every day | ORAL | 0 refills | Status: DC

## 2023-11-08 ENCOUNTER — Other Ambulatory Visit: Payer: Self-pay | Admitting: Family

## 2023-11-08 DIAGNOSIS — E782 Mixed hyperlipidemia: Secondary | ICD-10-CM

## 2023-11-27 ENCOUNTER — Ambulatory Visit: Admitting: Family

## 2023-12-15 ENCOUNTER — Other Ambulatory Visit

## 2023-12-15 ENCOUNTER — Encounter: Payer: Self-pay | Admitting: Family

## 2023-12-15 ENCOUNTER — Other Ambulatory Visit: Payer: Self-pay | Admitting: Family

## 2023-12-15 ENCOUNTER — Ambulatory Visit (INDEPENDENT_AMBULATORY_CARE_PROVIDER_SITE_OTHER): Admitting: Family

## 2023-12-15 DIAGNOSIS — R7303 Prediabetes: Secondary | ICD-10-CM | POA: Diagnosis not present

## 2023-12-15 DIAGNOSIS — B356 Tinea cruris: Secondary | ICD-10-CM

## 2023-12-15 DIAGNOSIS — E782 Mixed hyperlipidemia: Secondary | ICD-10-CM

## 2023-12-15 DIAGNOSIS — E559 Vitamin D deficiency, unspecified: Secondary | ICD-10-CM

## 2023-12-15 DIAGNOSIS — E039 Hypothyroidism, unspecified: Secondary | ICD-10-CM

## 2023-12-15 DIAGNOSIS — I10 Essential (primary) hypertension: Secondary | ICD-10-CM

## 2023-12-15 DIAGNOSIS — N9419 Other specified dyspareunia: Secondary | ICD-10-CM

## 2023-12-15 DIAGNOSIS — E538 Deficiency of other specified B group vitamins: Secondary | ICD-10-CM

## 2023-12-15 DIAGNOSIS — Z780 Asymptomatic menopausal state: Secondary | ICD-10-CM

## 2023-12-15 NOTE — Assessment & Plan Note (Signed)
 Blood pressure well controlled with current medications.  Continue current therapy.  Will reassess at follow up.   - CBC w/Diff - CMP w/eGFR

## 2023-12-15 NOTE — Assessment & Plan Note (Signed)
 Checking labs today.  Will continue supplements as needed.   - Vitamin D  - Vitamin B12 - TSH

## 2023-12-15 NOTE — Assessment & Plan Note (Signed)
.  A1C Continues to be in prediabetic ranges.  Will reassess at follow up after next lab check.  Patient counseled on dietary choices and verbalized understanding.   -CBC w/Diff -CMP w/eGFR -Hemoglobin A1C

## 2023-12-15 NOTE — Progress Notes (Signed)
 Established Patient Office Visit  Subjective:  Patient ID: Valerie Archer, female    DOB: 05-Dec-1965  Age: 58 y.o. MRN: [id number]  Chief Complaint  Patient presents with   Follow-up    1 month follow up    Patient is here today for her 1 month follow up.  She has been feeling well since last appointment.   She does not have additional concerns to discuss today.  Labs are not due today.  She needs refills.   I have reviewed her active problem list, medication list, allergies, notes from last encounter, lab results for her appointment today.       No other concerns at this time.   Past Medical History:  Diagnosis Date   ADHD (attention deficit hyperactivity disorder)    Anxiety    Depression    History of kidney stones    Hypothyroidism    Pre-diabetes    Thyroid disease     Past Surgical History:  Procedure Laterality Date   ABDOMINAL HYSTERECTOMY     CESAREAN SECTION     COLONOSCOPY W/ POLYPECTOMY     KNEE ARTHROSCOPY Right 08/21/2021   Procedure: ARTHROSCOPY KNEE, medial menisectomy;  Surgeon: Mardee Lynwood SQUIBB, MD;  Location: [facility];  Service: Orthopedics;  Laterality: Right;    Social History   Socioeconomic History   Marital status: Married    Spouse name: Not on file   Number of children: Not on file   Years of education: Not on file   Highest education level: Not on file  Occupational History   Not on file  Tobacco Use   Smoking status: Never   Smokeless tobacco: Never  Substance and Sexual Activity   Alcohol use: Yes    Comment: occasionally   Drug use: Never   Sexual activity: Not on file  Other Topics Concern   Not on file  Social History Narrative   Lives alone   Social Drivers of Health   Financial Resource Strain: Not on file  Food Insecurity: Not on file  Transportation Needs: Not on file  Physical Activity: Not on file  Stress: Not on file  Social Connections: Not on file  Intimate Partner Violence: Not on file    Family  History  Problem Relation Age of Onset   Breast cancer Cousin        mat cousin   Bladder Cancer Mother    Bladder Cancer Maternal Uncle     No Known Allergies  Review of Systems  All other systems reviewed and are negative.      Objective:   BP 126/72   Pulse (!) 59   Ht 5' 4 (1.626 m)   Wt 187 lb 12.8 oz (85.2 kg)   SpO2 97%   BMI 32.24 kg/m   Vitals:   12/15/23 1116  BP: 126/72  Pulse: (!) 59  Height: 5' 4 (1.626 m)  Weight: 187 lb 12.8 oz (85.2 kg)  SpO2: 97%  BMI (Calculated): 32.22    Physical Exam Vitals and nursing note reviewed.  Constitutional:      Appearance: Normal appearance. She is normal weight.  HENT:     Head: Normocephalic.  Eyes:     Extraocular Movements: Extraocular movements intact.     Conjunctiva/sclera: Conjunctivae normal.     Pupils: Pupils are equal, round, and reactive to light.  Cardiovascular:     Rate and Rhythm: Normal rate.  Pulmonary:     Effort: Pulmonary effort is normal.  Neurological:     General: No focal deficit present.     Mental Status: She is alert and oriented to person, place, and time. Mental status is at baseline.  Psychiatric:        Mood and Affect: Mood normal.        Behavior: Behavior normal.        Thought Content: Thought content normal.        Judgment: Judgment normal.     No results found for any visits on 12/15/23.  No results found for this or any previous visit (from the past 2160 hours).     Assessment & Plan Prediabetes A1C Continues to be in prediabetic ranges.  Will reassess at follow up after next lab check.  Patient counseled on dietary choices and verbalized understanding.   -CBC w/Diff -CMP w/eGFR -Hemoglobin A1C  Vitamin D  deficiency, unspecified B12 deficiency due to diet Hypothyroidism (acquired) Checking labs today.  Will continue supplements as needed.   - Vitamin D  - Vitamin B12 - TSH  Mixed hyperlipidemia Checking labs today.  Continue current therapy  for lipid control. Will modify as needed based on labwork results.   -CMP w/eGFR -Lipid Panel  Essential hypertension, benign Blood pressure well controlled with current medications.  Continue current therapy.  Will reassess at follow up.   - CBC w/Diff - CMP w/eGFR     Return in about 3 months (around 03/15/2024).   Total time spent: 20 minutes  ALAN CHRISTELLA ARRANT, FNP  12/15/2023   This document may have been prepared by [facility] Voice Recognition software and as such may include unintentional dictation errors.

## 2023-12-15 NOTE — Assessment & Plan Note (Signed)
 Checking labs today.  Continue current therapy for lipid control. Will modify as needed based on labwork results.   -CMP w/eGFR -Lipid Panel

## 2023-12-16 LAB — CMP14+EGFR
ALT: 11 IU/L (ref 0–32)
AST: 16 IU/L (ref 0–40)
Albumin: 4.2 g/dL (ref 3.8–4.9)
Alkaline Phosphatase: 109 IU/L (ref 44–121)
BUN/Creatinine Ratio: 23 (ref 9–23)
BUN: 19 mg/dL (ref 6–24)
Bilirubin Total: 0.8 mg/dL (ref 0.0–1.2)
CO2: 20 mmol/L (ref 20–29)
Calcium: 9.2 mg/dL (ref 8.7–10.2)
Chloride: 103 mmol/L (ref 96–106)
Creatinine, Ser: 0.82 mg/dL (ref 0.57–1.00)
Globulin, Total: 2.4 g/dL (ref 1.5–4.5)
Glucose: 92 mg/dL (ref 70–99)
Potassium: 4.1 mmol/L (ref 3.5–5.2)
Sodium: 139 mmol/L (ref 134–144)
Total Protein: 6.6 g/dL (ref 6.0–8.5)
eGFR: 82 mL/min/1.73 (ref >59)

## 2023-12-16 LAB — LIPID PANEL
Chol/HDL Ratio: 3.6 ratio (ref 0.0–4.4)
Cholesterol, Total: 202 mg/dL — ABNORMAL HIGH (ref 100–199)
HDL: 56 mg/dL (ref >39)
LDL Chol Calc (NIH): 130 mg/dL — ABNORMAL HIGH (ref 0–99)
Triglycerides: 90 mg/dL (ref 0–149)
VLDL Cholesterol Cal: 16 mg/dL (ref 5–40)

## 2023-12-16 LAB — CBC WITH DIFFERENTIAL/PLATELET
Basophils Absolute: 0.1 x10E3/uL (ref 0.0–0.2)
Basos: 2 %
EOS (ABSOLUTE): 0.1 x10E3/uL (ref 0.0–0.4)
Eos: 1 %
Hematocrit: 38.2 % (ref 34.0–46.6)
Hemoglobin: 12.6 g/dL (ref 11.1–15.9)
Immature Grans (Abs): 0 x10E3/uL (ref 0.0–0.1)
Immature Granulocytes: 0 %
Lymphocytes Absolute: 1.7 x10E3/uL (ref 0.7–3.1)
Lymphs: 37 %
MCH: 28.6 pg (ref 26.6–33.0)
MCHC: 33 g/dL (ref 31.5–35.7)
MCV: 87 fL (ref 79–97)
Monocytes Absolute: 0.3 x10E3/uL (ref 0.1–0.9)
Monocytes: 7 %
Neutrophils Absolute: 2.5 x10E3/uL (ref 1.4–7.0)
Neutrophils: 53 %
Platelets: 300 x10E3/uL (ref 150–450)
RBC: 4.41 x10E6/uL (ref 3.77–5.28)
RDW: 13.4 % (ref 11.7–15.4)
WBC: 4.6 x10E3/uL (ref 3.4–10.8)

## 2023-12-16 LAB — HEMOGLOBIN A1C
Est. average glucose Bld gHb Est-mCnc: 123 mg/dL
Hgb A1c MFr Bld: 5.9 % — ABNORMAL HIGH (ref 4.8–5.6)

## 2023-12-16 LAB — VITAMIN D 25 HYDROXY (VIT D DEFICIENCY, FRACTURES): Vit D, 25-Hydroxy: 54.9 ng/mL (ref 30.0–100.0)

## 2023-12-16 LAB — VITAMIN B12: Vitamin B-12: 327 pg/mL (ref 232–1245)

## 2023-12-16 LAB — TSH: TSH: 1.66 u[IU]/mL (ref 0.450–4.500)

## 2024-01-11 ENCOUNTER — Ambulatory Visit: Payer: Self-pay

## 2024-01-20 ENCOUNTER — Other Ambulatory Visit: Payer: Self-pay

## 2024-01-21 MED ORDER — AMPHETAMINE-DEXTROAMPHETAMINE 15 MG PO TABS: 15.0000 mg | ORAL_TABLET | Freq: Every day | ORAL | 0 refills | Status: AC

## 2024-04-01 ENCOUNTER — Other Ambulatory Visit: Payer: Self-pay | Admitting: Family

## 2024-04-18 ENCOUNTER — Ambulatory Visit: Admitting: Family

## 2024-04-23 ENCOUNTER — Other Ambulatory Visit: Payer: Self-pay | Admitting: Internal Medicine
